# Patient Record
Sex: Female | Born: 1935 | Hispanic: Refuse to answer | Marital: Married | Smoking: Never smoker
Health system: Southern US, Community
[De-identification: ages and names within clinical notes are randomized; demographics above are authoritative.]

## PROBLEM LIST (undated history)

## (undated) DIAGNOSIS — E119 Type 2 diabetes mellitus without complications: Secondary | ICD-10-CM

## (undated) DIAGNOSIS — I2510 Atherosclerotic heart disease of native coronary artery without angina pectoris: Secondary | ICD-10-CM

## (undated) DIAGNOSIS — R569 Unspecified convulsions: Secondary | ICD-10-CM

## (undated) HISTORY — PX: CARDIAC SURGERY: SHX584

## (undated) HISTORY — PX: COLONOSCOPY WITH ESOPHAGOGASTRODUODENOSCOPY (EGD): SHX5779

## (undated) HISTORY — PX: ABDOMINAL HYSTERECTOMY: SHX81

## (undated) HISTORY — PX: CHOLECYSTECTOMY: SHX55

---

## 2005-02-06 ENCOUNTER — Ambulatory Visit: Payer: Self-pay | Admitting: Internal Medicine

## 2005-11-18 ENCOUNTER — Ambulatory Visit: Payer: Self-pay | Admitting: Gastroenterology

## 2006-03-12 ENCOUNTER — Ambulatory Visit: Payer: Self-pay | Admitting: Internal Medicine

## 2007-03-24 ENCOUNTER — Ambulatory Visit: Payer: Self-pay | Admitting: Internal Medicine

## 2008-04-18 ENCOUNTER — Ambulatory Visit: Payer: Self-pay | Admitting: Internal Medicine

## 2009-04-23 ENCOUNTER — Ambulatory Visit: Payer: Self-pay | Admitting: Internal Medicine

## 2009-11-17 ENCOUNTER — Inpatient Hospital Stay: Payer: Self-pay | Admitting: Internal Medicine

## 2010-05-08 ENCOUNTER — Ambulatory Visit: Payer: Self-pay | Admitting: Internal Medicine

## 2010-05-15 ENCOUNTER — Ambulatory Visit: Payer: Self-pay | Admitting: Internal Medicine

## 2010-06-11 ENCOUNTER — Ambulatory Visit: Payer: Self-pay | Admitting: Ophthalmology

## 2010-10-23 ENCOUNTER — Ambulatory Visit: Payer: Self-pay | Admitting: Ophthalmology

## 2010-11-05 ENCOUNTER — Ambulatory Visit: Payer: Self-pay | Admitting: Ophthalmology

## 2011-04-07 ENCOUNTER — Ambulatory Visit: Payer: Self-pay | Admitting: Gastroenterology

## 2011-04-08 LAB — PATHOLOGY REPORT

## 2011-08-04 ENCOUNTER — Ambulatory Visit: Payer: Self-pay | Admitting: Internal Medicine

## 2012-08-04 ENCOUNTER — Ambulatory Visit: Payer: Self-pay | Admitting: Internal Medicine

## 2012-12-22 ENCOUNTER — Inpatient Hospital Stay: Payer: Self-pay | Admitting: Internal Medicine

## 2012-12-22 LAB — URINALYSIS, COMPLETE
Bilirubin,UR: NEGATIVE
Hyaline Cast: 7
Ketone: NEGATIVE
Nitrite: POSITIVE
Ph: 6 (ref 4.5–8.0)
Protein: NEGATIVE
RBC,UR: 16 /HPF (ref 0–5)
Specific Gravity: 1.01 (ref 1.003–1.030)
Squamous Epithelial: 3

## 2012-12-22 LAB — CK TOTAL AND CKMB (NOT AT ARMC)
CK, Total: 68 U/L (ref 21–215)
CK-MB: 0.9 ng/mL (ref 0.5–3.6)

## 2012-12-22 LAB — COMPREHENSIVE METABOLIC PANEL
Albumin: 3.8 g/dL (ref 3.4–5.0)
Alkaline Phosphatase: 69 U/L (ref 50–136)
Anion Gap: 9 (ref 7–16)
Calcium, Total: 10 mg/dL (ref 8.5–10.1)
Co2: 27 mmol/L (ref 21–32)
Glucose: 109 mg/dL — ABNORMAL HIGH (ref 65–99)
Osmolality: 283 (ref 275–301)
Potassium: 4.1 mmol/L (ref 3.5–5.1)
SGPT (ALT): 15 U/L (ref 12–78)
Sodium: 137 mmol/L (ref 136–145)

## 2012-12-22 LAB — CBC
HCT: 37.8 % (ref 35.0–47.0)
HGB: 12.7 g/dL (ref 12.0–16.0)
MCH: 28.4 pg (ref 26.0–34.0)
MCV: 85 fL (ref 80–100)
Platelet: 198 10*3/uL (ref 150–440)
RBC: 4.47 10*6/uL (ref 3.80–5.20)
WBC: 9.8 10*3/uL (ref 3.6–11.0)

## 2012-12-22 LAB — PROTIME-INR
INR: 2.2
Prothrombin Time: 24.3 secs — ABNORMAL HIGH (ref 11.5–14.7)

## 2012-12-22 LAB — TROPONIN I: Troponin-I: <0.02 ng/mL

## 2012-12-23 LAB — CBC WITH DIFFERENTIAL/PLATELET
Basophil #: 0.1 10*3/uL (ref 0.0–0.1)
Basophil %: 0.7 %
HCT: 34.5 % — ABNORMAL LOW (ref 35.0–47.0)
HGB: 11.4 g/dL — ABNORMAL LOW (ref 12.0–16.0)
MCH: 27.9 pg (ref 26.0–34.0)
MCHC: 33.1 g/dL (ref 32.0–36.0)
MCV: 84 fL (ref 80–100)
Monocyte %: 11.3 %
Neutrophil #: 6.6 10*3/uL — ABNORMAL HIGH (ref 1.4–6.5)
Neutrophil %: 72.8 %
Platelet: 169 10*3/uL (ref 150–440)
RBC: 4.1 10*6/uL (ref 3.80–5.20)
RDW: 13.7 % (ref 11.5–14.5)

## 2012-12-23 LAB — BASIC METABOLIC PANEL
Anion Gap: 10 (ref 7–16)
BUN: 34 mg/dL — ABNORMAL HIGH (ref 7–18)
Chloride: 103 mmol/L (ref 98–107)
Co2: 24 mmol/L (ref 21–32)
Creatinine: 2.5 mg/dL — ABNORMAL HIGH (ref 0.60–1.30)
EGFR (African American): 20 — ABNORMAL LOW
Osmolality: 282 (ref 275–301)
Potassium: 4.3 mmol/L (ref 3.5–5.1)
Sodium: 137 mmol/L (ref 136–145)

## 2012-12-23 LAB — LIPID PANEL
HDL Cholesterol: 36 mg/dL — ABNORMAL LOW (ref 40–60)
VLDL Cholesterol, Calc: 19 mg/dL (ref 5–40)

## 2012-12-23 LAB — PROTIME-INR: INR: 2.5

## 2012-12-24 LAB — CBC WITH DIFFERENTIAL/PLATELET
Basophil #: 0.1 10*3/uL (ref 0.0–0.1)
Basophil %: 1 %
Eosinophil %: 4.3 %
HCT: 33.4 % — ABNORMAL LOW (ref 35.0–47.0)
Monocyte %: 12.6 %
Neutrophil #: 4.5 10*3/uL (ref 1.4–6.5)
Neutrophil %: 61.4 %
RBC: 4 10*6/uL (ref 3.80–5.20)
RDW: 13.7 % (ref 11.5–14.5)
WBC: 7.3 10*3/uL (ref 3.6–11.0)

## 2012-12-24 LAB — BASIC METABOLIC PANEL
Anion Gap: 10 (ref 7–16)
BUN: 35 mg/dL — ABNORMAL HIGH (ref 7–18)
Chloride: 105 mmol/L (ref 98–107)
EGFR (African American): 22 — ABNORMAL LOW
EGFR (Non-African Amer.): 19 — ABNORMAL LOW
Osmolality: 285 (ref 275–301)
Potassium: 4.2 mmol/L (ref 3.5–5.1)
Sodium: 138 mmol/L (ref 136–145)

## 2012-12-24 LAB — PROTIME-INR: Prothrombin Time: 23.8 secs — ABNORMAL HIGH (ref 11.5–14.7)

## 2012-12-24 LAB — URINE CULTURE

## 2013-08-05 ENCOUNTER — Ambulatory Visit: Payer: Self-pay | Admitting: Internal Medicine

## 2013-08-25 ENCOUNTER — Ambulatory Visit: Payer: Self-pay

## 2015-02-23 NOTE — Discharge Summary (Signed)
PATIENT NAME:  Laura Kim, Laura Kim MR#:  [id number] DATE OF BIRTH:  06-Dec-1929  DATE OF ADMISSION:  12/22/2012 DATE OF DISCHARGE:  12/24/2012  DISCHARGE DIAGNOSES:  1. Evidence for transient ischemic attack versus migraine with aura.  2. Urinary tract infection.  3. Left eye visual changes as noted above.  4. Type 2 diabetes.  5. Hypertension.  6. Hyperlipidemia.  7. Atrial fibrillation.  8. Chronic kidney disease, stage III.   DISCHARGE MEDICATIONS: Per med reconciliation.   HISTORY AND PHYSICAL: Please see detailed history and physical done on admission.   HOSPITAL COURSE: The patient was admitted after 30 minutes of left visual changes, did have a headache afterwards, and she has no history of migraines. Workup was negative with normal MRI, normal CT. She did have a complex-appearing cystic structure on a renal ultrasound that was likely cystic but not clearly so. Radiology recommended a CT scan, but she cannot do that with her kidney dysfunction. Her creatinine was stable around about 2.5 range, cholesterol was good at 120, the LDL 65 on simvastatin. The INR was stable on warfarin which she has been on chronically and has been stable. On urinalysis, showed a urinary tract infection with 668 white cells per high-power field. A urine culture is not available yet at this point. She seems to respond clinically to Cipro so we will continue that. INR to date is not increased substantially on that.  ____________________________ Ocie Cornfield. Ouida Sills, MD mwa:jm D: 12/24/2012 07:38:03 ET T: 12/24/2012 11:42:26 ET JOB#: [id number]  cc: Ocie Cornfield. Ouida Sills, MD, <Dictator> Kirk Ruths MD ELECTRONICALLY SIGNED 12/27/2012 7:21

## 2015-02-23 NOTE — H&P (Signed)
PATIENT NAME:  Laura Kim, Laura Kim MR#:  [id number] DATE OF BIRTH:  05-21-1930  DATE OF ADMISSION:  12/22/2012  PRIMARY CARE PHYSICIAN:  Ocie Cornfield. Ouida Sills, MD  CHIEF COMPLAINT:  Confusion and could not focus left eye.   HISTORY OF PRESENT ILLNESS:  This is an 81 year old female who presents to the ER. She was going to a ballgame. She was unable to focus her left eye and her fingers fell asleep, both index fingers. That episode passed and then she felt fine. She was sitting on the bleachers watching the ball game and she could not answer questions appropriately from her family. She was confused, tongue-tied, could not comprehend, the words were mixed up, her tongue was thick and that episode lasted 30 minutes and then resolved. She was then confused again on the way to the Emergency Room. She is currently answering questions appropriately. 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 Her white count was normal, but she was found to be in acute renal failure with a creatinine of 2.54 and urinalysis that showed leukocyte esterase and 2+ blood. Hospitalist services were contacted for further evaluation.   PAST MEDICAL HISTORY:  Hypertension, diabetes, coronary artery disease, atrial fibrillation, hyperlipidemia and possible asthma.   PAST SURGICAL HISTORY:  Cataracts, CABG, gallbladder, stent in the right renal artery, hysterectomy.   ALLERGIES:  PENICILLIN.   MEDICATIONS:  Include Advair Diskus 250/50 mcg 1 inhalation twice a day, amlodipine/benazepril probably 5/20 mg once a day, aspirin 81 mg daily, calcium and vitamin D twice a day, carvedilol 12.5 mg twice a day, Humalog 6 units twice a day, Jantoven which is Coumadin 5 mg in the evening, Lantus 8 mg at bedtime, simvastatin 20 mg at bedtime, spironolactone 25 mg daily, torsemide 5 mg daily.    SOCIAL HISTORY:  No smoking. No alcohol. No drug use. 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   FAMILY HISTORY:  Mother died at 63 of an MI, also had colon cancer. Father died at 23 of an MI.   REVIEW OF SYSTEMS:   CONSTITUTIONAL: No fever. Positive for chills, no sweats. Positive for left-sided weakness.  EYES: Left-sided visual change.  EARS, NOSE, MOUTH AND THROAT: Positive for runny nose. No sore throat. No difficulty swallowing.  CARDIOVASCULAR: No chest pain. No palpitations.  RESPIRATORY: Positive for shortness of breath. No coughing. No sputum. No hemoptysis.  GASTROINTESTINAL: No nausea. No vomiting. No abdominal pain. Positive for diarrhea on and off since last March. Dr. Vira Agar put him on Imodium.  GENITOURINARY: Positive for burning on urination. Had urinary tract infection last month.  INTEGUMENT: No rashes or eruptions.  NEUROLOGIC: Positive for slurred speech and weakness on the left side.  PSYCHIATRIC: No anxiety or depression.  ENDOCRINE: No thyroid problems.  HEMATOLOGIC/LYMPHATIC: No anemia. No easy bruising or bleeding.   PHYSICAL EXAMINATION: VITAL SIGNS: Temperature 98.7, pulse 65, respirations 20, blood pressure 169/74, pulse oximetry 96%.  GENERAL: No respiratory distress.  EYES: Conjunctivae and lids normal. Pupils equal, round, and reactive to light. Extraocular muscles intact. No nystagmus.  EARS, NOSE, MOUTH AND THROAT: Tympanic membranes: No erythema. Nasal mucosa: No erythema. Throat: No erythema, no exudate seen. Lips and gums: No lesions.  NECK: No JVD. No bruits. No lymphadenopathy. No thyromegaly. No thyroid nodules palpated.  RESPIRATORY: Lungs are clear to auscultation. No  use of accessory muscles to breathe. No rhonchi, rales or wheeze heard.  CARDIOVASCULAR: S1, S2 normal. No gallops, rubs, or murmurs heard. Carotid upstroke 2+ bilaterally. No bruits.  EXTREMITIES: Dorsalis pedis pulses 2+ bilaterally. No edema of the lower  extremity.  ABDOMEN: Soft, nontender. No organosplenomegaly. Normoactive bowel sounds. No masses felt.  LYMPHATICS: No lymph nodes in the neck.  MUSCULOSKELETAL: No clubbing, edema or cyanosis.  SKIN: No rashes or ulcers seen.  NEUROLOGIC: Cranial nerves II through XII grossly intact. Deep tendon reflexes 1+ bilateral lower extremity. Power 5 out of 5 in upper and lower extremities. Finger to nose intact. Babinski equivocal on the right and negative on the left. Sensation intact to light touch. Visual acuity left eye with reading glasses at 20/40.   ASSESSMENT AND PLAN:   1.  Acute renal failure. We will give intravenous fluid hydration and hold benazepril, torsemide and spironolactone at this point. We will get a renal ultrasound.  2.  Urinary tract infection. I will give intravenous Cipro 400 mg b.i.d. and send off a urine culture.  3.  Possible transient ischemic attack with left visual issue and concentration issue. This could also be secondary to acute renal failure and urinary tract infection, but since the patient did have a visual issue, we will get an MRI of the brain to rule out transient ischemic attack or stroke. The patient is already on aspirin and Coumadin, not much more to do for stroke prevention.  4.  Hypertension. Blood pressure is slightly elevated at 169/74. We will continue her usual medications except for the benazepril at this point and torsemide and continue to monitor. Allow blood pressure to be a little bit higher today with a possible transient ischemic attack.  5.  Diabetes. Continue Lantus and Humalog sliding scale.  6.  Coronary artery disease and also atrial fibrillation. Coumadin level is therapeutic. The patient is on aspirin also and rate controlled with Coreg.  7.  Hyperlipidemia: The patient is on Zocor. We will check a lipid profile in the a.m.   TIME SPENT ON ADMISSION:  55 minutes.   The patient is a DNR.    ____________________________ Tana Conch.  Leslye Peer, MD rjw:si D: 12/22/2012 22:27:17 ET T: 12/22/2012 23:20:48 ET JOB#: [id number]  cc: Tana Conch. Leslye Peer, MD, <Dictator> Ocie Cornfield. Ouida Sills, [place] MD ELECTRONICALLY SIGNED 12/29/2012 17:34

## 2015-11-14 ENCOUNTER — Other Ambulatory Visit
Admission: RE | Admit: 2015-11-14 | Discharge: 2015-11-14 | Disposition: A | Discharge status: Home / Self Care | Payer: Medicare Other | Source: Ambulatory Visit | Attending: Podiatry | Admitting: Podiatry

## 2015-11-14 DIAGNOSIS — L02611 Cutaneous abscess of right foot: Secondary | ICD-10-CM | POA: Diagnosis present

## 2015-11-22 LAB — ANAEROBIC CULTURE: CULTURE: NEGATIVE

## 2016-03-27 LAB — WOUND CULTURE

## 2017-06-02 ENCOUNTER — Other Ambulatory Visit
Admission: RE | Admit: 2017-06-02 | Discharge: 2017-06-02 | Disposition: A | Discharge status: Home / Self Care | Payer: Medicare Other | Source: Ambulatory Visit | Attending: Gastroenterology | Admitting: Gastroenterology

## 2017-06-02 DIAGNOSIS — R197 Diarrhea, unspecified: Secondary | ICD-10-CM | POA: Insufficient documentation

## 2017-06-02 DIAGNOSIS — R1084 Generalized abdominal pain: Secondary | ICD-10-CM | POA: Insufficient documentation

## 2017-06-02 LAB — GASTROINTESTINAL PANEL BY PCR, STOOL (REPLACES STOOL CULTURE)

## 2017-06-02 LAB — C DIFFICILE QUICK SCREEN W PCR REFLEX
C Diff antigen: NEGATIVE
C Diff interpretation: NOT DETECTED
C Diff toxin: NEGATIVE

## 2017-08-20 ENCOUNTER — Other Ambulatory Visit
Admission: RE | Admit: 2017-08-20 | Discharge: 2017-08-20 | Disposition: A | Discharge status: Home / Self Care | Payer: Medicare Other | Source: Ambulatory Visit | Attending: Nurse Practitioner | Admitting: Nurse Practitioner

## 2017-08-20 DIAGNOSIS — R197 Diarrhea, unspecified: Secondary | ICD-10-CM | POA: Insufficient documentation

## 2017-08-24 LAB — PANCREATIC ELASTASE, FECAL: Pancreatic Elastase-1, Stool: 201 ug Elast./g (ref >200)

## 2019-07-13 ENCOUNTER — Encounter: Payer: Self-pay | Admitting: Emergency Medicine

## 2019-07-13 ENCOUNTER — Other Ambulatory Visit: Payer: Self-pay

## 2019-07-13 ENCOUNTER — Emergency Department: Payer: Medicare Other

## 2019-07-13 ENCOUNTER — Inpatient Hospital Stay
Admission: EM | Admit: 2019-07-13 | Discharge: 2019-07-20 | DRG: 377 | Disposition: A | Discharge status: Home Health | Payer: Medicare Other | Attending: Internal Medicine | Admitting: Internal Medicine

## 2019-07-13 DIAGNOSIS — Z90710 Acquired absence of both cervix and uterus: Secondary | ICD-10-CM | POA: Diagnosis not present

## 2019-07-13 DIAGNOSIS — Z7951 Long term (current) use of inhaled steroids: Secondary | ICD-10-CM

## 2019-07-13 DIAGNOSIS — R0602 Shortness of breath: Secondary | ICD-10-CM | POA: Diagnosis present

## 2019-07-13 DIAGNOSIS — Z951 Presence of aortocoronary bypass graft: Secondary | ICD-10-CM

## 2019-07-13 DIAGNOSIS — Z7982 Long term (current) use of aspirin: Secondary | ICD-10-CM

## 2019-07-13 DIAGNOSIS — Z20828 Contact with and (suspected) exposure to other viral communicable diseases: Secondary | ICD-10-CM | POA: Diagnosis present

## 2019-07-13 DIAGNOSIS — K921 Melena: Secondary | ICD-10-CM

## 2019-07-13 DIAGNOSIS — I4821 Permanent atrial fibrillation: Secondary | ICD-10-CM | POA: Diagnosis present

## 2019-07-13 DIAGNOSIS — Z7901 Long term (current) use of anticoagulants: Secondary | ICD-10-CM | POA: Diagnosis not present

## 2019-07-13 DIAGNOSIS — Z880 Allergy status to penicillin: Secondary | ICD-10-CM

## 2019-07-13 DIAGNOSIS — K31819 Angiodysplasia of stomach and duodenum without bleeding: Secondary | ICD-10-CM | POA: Diagnosis not present

## 2019-07-13 DIAGNOSIS — I5033 Acute on chronic diastolic (congestive) heart failure: Secondary | ICD-10-CM | POA: Diagnosis present

## 2019-07-13 DIAGNOSIS — K922 Gastrointestinal hemorrhage, unspecified: Secondary | ICD-10-CM | POA: Diagnosis not present

## 2019-07-13 DIAGNOSIS — Z23 Encounter for immunization: Secondary | ICD-10-CM

## 2019-07-13 DIAGNOSIS — E876 Hypokalemia: Secondary | ICD-10-CM | POA: Diagnosis present

## 2019-07-13 DIAGNOSIS — E1122 Type 2 diabetes mellitus with diabetic chronic kidney disease: Secondary | ICD-10-CM | POA: Diagnosis present

## 2019-07-13 DIAGNOSIS — D631 Anemia in chronic kidney disease: Secondary | ICD-10-CM | POA: Diagnosis present

## 2019-07-13 DIAGNOSIS — I130 Hypertensive heart and chronic kidney disease with heart failure and stage 1 through stage 4 chronic kidney disease, or unspecified chronic kidney disease: Secondary | ICD-10-CM | POA: Diagnosis present

## 2019-07-13 DIAGNOSIS — I2510 Atherosclerotic heart disease of native coronary artery without angina pectoris: Secondary | ICD-10-CM | POA: Diagnosis present

## 2019-07-13 DIAGNOSIS — N179 Acute kidney failure, unspecified: Secondary | ICD-10-CM | POA: Diagnosis present

## 2019-07-13 DIAGNOSIS — R791 Abnormal coagulation profile: Secondary | ICD-10-CM

## 2019-07-13 DIAGNOSIS — D649 Anemia, unspecified: Secondary | ICD-10-CM | POA: Diagnosis present

## 2019-07-13 DIAGNOSIS — J189 Pneumonia, unspecified organism: Secondary | ICD-10-CM | POA: Diagnosis present

## 2019-07-13 DIAGNOSIS — J9600 Acute respiratory failure, unspecified whether with hypoxia or hypercapnia: Secondary | ICD-10-CM

## 2019-07-13 DIAGNOSIS — Z79899 Other long term (current) drug therapy: Secondary | ICD-10-CM

## 2019-07-13 DIAGNOSIS — Z794 Long term (current) use of insulin: Secondary | ICD-10-CM | POA: Diagnosis not present

## 2019-07-13 DIAGNOSIS — N184 Chronic kidney disease, stage 4 (severe): Secondary | ICD-10-CM | POA: Diagnosis present

## 2019-07-13 DIAGNOSIS — K31811 Angiodysplasia of stomach and duodenum with bleeding: Secondary | ICD-10-CM | POA: Diagnosis present

## 2019-07-13 DIAGNOSIS — E785 Hyperlipidemia, unspecified: Secondary | ICD-10-CM | POA: Diagnosis present

## 2019-07-13 DIAGNOSIS — J9601 Acute respiratory failure with hypoxia: Secondary | ICD-10-CM | POA: Diagnosis present

## 2019-07-13 DIAGNOSIS — D6832 Hemorrhagic disorder due to extrinsic circulating anticoagulants: Secondary | ICD-10-CM | POA: Diagnosis present

## 2019-07-13 DIAGNOSIS — Z66 Do not resuscitate: Secondary | ICD-10-CM | POA: Diagnosis not present

## 2019-07-13 DIAGNOSIS — K449 Diaphragmatic hernia without obstruction or gangrene: Secondary | ICD-10-CM | POA: Diagnosis present

## 2019-07-13 DIAGNOSIS — Z8249 Family history of ischemic heart disease and other diseases of the circulatory system: Secondary | ICD-10-CM

## 2019-07-13 DIAGNOSIS — D62 Acute posthemorrhagic anemia: Secondary | ICD-10-CM | POA: Diagnosis present

## 2019-07-13 HISTORY — DX: Unspecified convulsions: R56.9

## 2019-07-13 HISTORY — DX: Atherosclerotic heart disease of native coronary artery without angina pectoris: I25.10

## 2019-07-13 HISTORY — DX: Type 2 diabetes mellitus without complications: E11.9

## 2019-07-13 LAB — URINALYSIS, COMPLETE (UACMP) WITH MICROSCOPIC
Bilirubin Urine: NEGATIVE
Glucose, UA: 50 mg/dL — AB
Ketones, ur: NEGATIVE mg/dL
Nitrite: NEGATIVE
Protein, ur: 100 mg/dL — AB
Specific Gravity, Urine: 1.011 (ref 1.005–1.030)
WBC, UA: >50 WBC/hpf — ABNORMAL HIGH (ref 0–5)
pH: 5 (ref 5.0–8.0)

## 2019-07-13 LAB — CBC
HCT: 18.5 % — ABNORMAL LOW (ref 36.0–46.0)
Hemoglobin: 5.7 g/dL — ABNORMAL LOW (ref 12.0–15.0)
MCH: 27.7 pg (ref 26.0–34.0)
MCHC: 30.8 g/dL (ref 30.0–36.0)
MCV: 89.8 fL (ref 80.0–100.0)
Platelets: 257 10*3/uL (ref 150–400)
RBC: 2.06 MIL/uL — ABNORMAL LOW (ref 3.87–5.11)
RDW: 16.1 % — ABNORMAL HIGH (ref 11.5–15.5)
WBC: 5.9 10*3/uL (ref 4.0–10.5)
nRBC: 0 % (ref 0.0–0.2)

## 2019-07-13 LAB — ABO/RH: ABO/RH(D): A POS

## 2019-07-13 LAB — BASIC METABOLIC PANEL
Anion gap: 12 (ref 5–15)
BUN: 83 mg/dL — ABNORMAL HIGH (ref 8–23)
CO2: 22 mmol/L (ref 22–32)
Calcium: 10.2 mg/dL (ref 8.9–10.3)
Chloride: 104 mmol/L (ref 98–111)
Creatinine, Ser: 4.96 mg/dL — ABNORMAL HIGH (ref 0.44–1.00)
GFR calc Af Amer: 9 mL/min — ABNORMAL LOW (ref >60)
GFR calc non Af Amer: 7 mL/min — ABNORMAL LOW (ref >60)
Glucose, Bld: 218 mg/dL — ABNORMAL HIGH (ref 70–99)
Potassium: 4.3 mmol/L (ref 3.5–5.1)
Sodium: 138 mmol/L (ref 135–145)

## 2019-07-13 LAB — PREPARE RBC (CROSSMATCH)

## 2019-07-13 LAB — PROTIME-INR
INR: 10.1 (ref 0.8–1.2)
Prothrombin Time: 78.6 seconds — ABNORMAL HIGH (ref 11.4–15.2)

## 2019-07-13 MED ORDER — SODIUM CHLORIDE 0.9% IV SOLUTION
Freq: Once | INTRAVENOUS | Status: AC
  Administered 2019-07-14: via INTRAVENOUS
  Filled 2019-07-13: qty 250

## 2019-07-13 MED ORDER — SODIUM CHLORIDE 0.9% FLUSH
3.0000 mL | Freq: Once | INTRAVENOUS | Status: AC
  Administered 2019-07-14: 3 mL via INTRAVENOUS

## 2019-07-13 MED ORDER — PANTOPRAZOLE SODIUM 40 MG IV SOLR: 40.0000 mg | Freq: Two times a day (BID) | INTRAVENOUS | Status: DC

## 2019-07-13 MED ORDER — VITAMIN K1 10 MG/ML IJ SOLN
10.0000 mg | Freq: Once | INTRAVENOUS | Status: AC
  Administered 2019-07-14: 10 mg via INTRAVENOUS
  Filled 2019-07-13: qty 1

## 2019-07-13 MED ORDER — SODIUM CHLORIDE 0.9 % IV SOLN
80.0000 mg | Freq: Once | INTRAVENOUS | Status: AC
  Administered 2019-07-14: 80 mg via INTRAVENOUS
  Filled 2019-07-13: qty 80

## 2019-07-13 MED ORDER — SODIUM CHLORIDE 0.9 % IV SOLN
8.0000 mg/h | INTRAVENOUS | Status: DC
  Administered 2019-07-14 – 2019-07-15 (×2): 8 mg/h via INTRAVENOUS
  Filled 2019-07-13 (×3): qty 80

## 2019-07-13 NOTE — H&P (Signed)
Laura Kim at [facility] NAME: Laura Kim    MR#:  [id number]  DATE OF BIRTH:  02-29-1930  DATE OF ADMISSION:  07/13/2019  PRIMARY CARE PHYSICIAN: Laura Ruths, MD   REQUESTING/REFERRING PHYSICIAN: Blake Divine, MD  CHIEF COMPLAINT:   Chief Complaint  Patient presents with  . Weakness    HISTORY OF PRESENT ILLNESS:  Laura Kim  is a 85 y.o. female with a known history of CAD, A. fib on Coumadin, and diabetes who presents to the ED complaining of generalized weakness.  Patient reports that she has been feeling increasingly weak over the past 1 to 2 weeks.  She was seen by her PCP who referred her to the ED for abnormal labs/low hemoglobin.  Her hemoglobin was 5.7 in the emergency department.  She was ordered 2 unit of packed red blood cell transfusion while in ED. Patient states that she has noticed dark black stools over about the past week and has been getting out of breath very easily.  She denies any pain in her chest and has not had any fevers, chills, or cough. PAST MEDICAL HISTORY:   Past Medical History:  Diagnosis Date  . Coronary artery disease   . Diabetes mellitus without complication ([place])    PAST SURGICAL HISTORY:   Past Surgical History:  Procedure Laterality Date  . ABDOMINAL HYSTERECTOMY    . CARDIAC SURGERY      SOCIAL HISTORY:   Social History   Tobacco Use  . Smoking status: Never Smoker  Substance Use Topics  . Alcohol use: Never    Frequency: Never   FAMILY HISTORY:  No family history on file. Mother died of cancer and father had heart disease DRUG ALLERGIES:   Allergies  Allergen Reactions  . Penicillins Itching, Rash and Other (See Comments)    Did it involve swelling of the face/tongue/throat, SOB, or low BP? NO Did it involve sudden or severe rash/hives, skin peeling, or any reaction on the inside of your mouth or nose? Yes Did you need to seek medical attention at a  hospital or doctor's office? Unknown When did it last happen?>35 years If all above answers are "NO", may proceed with cephalosporin use.    REVIEW OF SYSTEMS:   Review of Systems  Constitutional: Positive for malaise/fatigue. Negative for diaphoresis, fever and weight loss.  HENT: Negative for ear discharge, ear pain, hearing loss, nosebleeds, sore throat and tinnitus.   Eyes: Negative for blurred vision and pain.  Respiratory: Negative for cough, hemoptysis, shortness of breath and wheezing.   Cardiovascular: Negative for chest pain, palpitations, orthopnea and leg swelling.  Gastrointestinal: Negative for abdominal pain, blood in stool, constipation, diarrhea, heartburn, nausea and vomiting.  Genitourinary: Negative for dysuria, frequency and urgency.  Musculoskeletal: Negative for back pain and myalgias.  Skin: Negative for itching and rash.  Neurological: Negative for dizziness, tingling, tremors, focal weakness, seizures, weakness and headaches.  Psychiatric/Behavioral: Negative for depression. The patient is not nervous/anxious.     MEDICATIONS AT HOME:   Prior to Admission medications   Not on File      VITAL SIGNS:  Blood pressure 135/79, pulse 71, temperature 98 F (36.7 C), temperature source Oral, resp. rate (!) 21, height 5' (1.524 m), weight 99.8 kg, SpO2 100 %.  PHYSICAL EXAMINATION:  Physical Exam  GENERAL:  85 y.o.-year-old patient lying in the bed with no acute distress.  EYES: Pupils equal, round, reactive to light and accommodation. No  scleral icterus. Extraocular muscles intact.  HEENT: Head atraumatic, normocephalic. Oropharynx and nasopharynx clear.  NECK:  Supple, no jugular venous distention. No thyroid enlargement, no tenderness.  LUNGS: Normal breath sounds bilaterally, no wheezing, rales,rhonchi or crepitation. No use of accessory muscles of respiration.  CARDIOVASCULAR: S1, S2 normal. No murmurs, rubs, or gallops.  ABDOMEN: Soft, nontender,  nondistended. Bowel sounds present. No organomegaly or mass.  EXTREMITIES: No pedal edema, cyanosis, or clubbing.  NEUROLOGIC: Cranial nerves II through XII are intact. Muscle strength 5/5 in all extremities. Sensation intact. Gait not checked.  PSYCHIATRIC: The patient is alert and oriented x 3.  SKIN: No obvious rash, lesion, or ulcer.   LABORATORY PANEL:   CBC Recent Labs  Lab 07/13/19 1945  WBC 5.9  HGB 5.7*  HCT 18.5*  PLT 257   ------------------------------------------------------------------------------------------------------------------  Chemistries  Recent Labs  Lab 07/13/19 1945  NA 138  K 4.3  CL 104  CO2 22  GLUCOSE 218*  BUN 83*  CREATININE 4.96*  CALCIUM 10.2   ------------------------------------------------------------------------------------------------------------------  Cardiac Enzymes No results for input(s): TROPONINI in the last 168 hours. ------------------------------------------------------------------------------------------------------------------  RADIOLOGY:  Dg Chest Portable 1 View  Result Date: 07/13/2019 CLINICAL DATA:  Shortness of breath EXAM: PORTABLE CHEST 1 VIEW COMPARISON:  [facility] nineteenth 2014 FINDINGS: There is cardiomegaly with overlying median sternotomy wires. There is pulmonary vascular congestion. There is a trace left pleural effusion. Aortic valve calcifications and mitral valve calcifications are noted. No acute osseous abnormality. IMPRESSION: Pulmonary vascular congestion and probable trace left pleural effusion. Electronically Signed   By: Prudencio Pair M.D.   On: 07/13/2019 22:44   IMPRESSION AND PLAN:  85 year old female is being admitted for anemia  *Anemia: Likely acute on chronic blood loss -2 packed red blood cell transfusion -She is hemodynamically stable, no active GI bleeding -Protonix IV twice daily -GI consultation  *Acquired coagulopathy:  - Her INR was 10.1, she has been given vitamin K in the  emergency department -Hold Coumadin  *Acute on chronic kidney disease stage IV -Hydrate with IV fluids -Obtain renal ultrasound -Monitor kidney function -If no improvement with hydration, consider nephrology consultation -Avoid nephrotoxic medication  *Permanent atrial fibrillation: On Coumadin -Rate is well controlled, holding Coumadin as INR 10.1   All the records are reviewed and case discussed with ED provider. Management plans discussed with the patient, nursing and they are in agreement.  CODE STATUS: Full code  TOTAL TIME (critical care) TAKING CARE OF THIS PATIENT: 45 minutes.    Max Sane M.D on 07/14/2019 at 12:58 AM  Between 7am to 6pm - Pager - [phone]  After 6pm go to [email or web address] - [employer] bre[employer] [place] [facility]  Office  [phone]  CC: Primary care physician; Laura Ruths, MD   Note: This dictation was prepared with Dragon dictation along with smaller phrase technology. Any transcriptional errors that result from this process are unintentional.

## 2019-07-13 NOTE — ED Triage Notes (Signed)
Pt to ER states she was seen at PCP today because she was not feeling well.  States she got a call after coming home that she needed to come out to ER due to low blood counts.

## 2019-07-13 NOTE — ED Provider Notes (Signed)
[facility] Emergency Department Provider Note   ____________________________________________   First MD Initiated Contact with Patient 07/13/19 2206     (approximate)  I have reviewed the triage vital signs and the nursing notes.   HISTORY  Chief Complaint Weakness    HPI Laura Kim is a 85 y.o. female with past medical history of CAD, A. fib on Coumadin, and diabetes who presents to the ED complaining of generalized weakness.  Patient reports that she has been feeling increasingly weak over the past 1 to 2 weeks.  She was seen by her PCP for this problem earlier today and referred to the ED when found to have a low hemoglobin.  Patient states that she has noticed dark black stools over about the past week and has been getting out of breath very easily.  She denies any pain in her chest and has not had any fevers, chills, or cough.        Past Medical History:  Diagnosis Date  . Coronary artery disease   . Diabetes mellitus without complication [facility])     Patient Active Problem List   Diagnosis Date Noted  . Anemia 07/13/2019    Past Surgical History:  Procedure Laterality Date  . ABDOMINAL HYSTERECTOMY    . CARDIAC SURGERY      Prior to Admission medications   Medication Sig Start Date End Date Taking? Authorizing Provider  albuterol (VENTOLIN HFA) 108 (90 Base) MCG/ACT inhaler Inhale 2 puffs into the lungs every 6 (six) hours as needed for wheezing or shortness of breath (cough).   Yes [provider]  amLODipine (NORVASC) 10 MG tablet Take 10 mg by mouth daily.   Yes [provider]  aspirin 81 MG chewable tablet Chew 81 mg by mouth daily.   Yes [provider]  benazepril (LOTENSIN) 20 MG tablet Take 20 mg by mouth daily.   Yes [provider]  Fluticasone-Salmeterol (ADVAIR) 250-50 MCG/DOSE AEPB Inhale 1 puff into the lungs 2 (two) times daily.   Yes [provider]  insulin glargine  (LANTUS) 100 unit/mL SOPN Inject 6 Units into the skin at bedtime.   Yes [provider]  insulin lispro (HUMALOG KWIKPEN) 100 UNIT/ML KwikPen Inject 6 Units into the skin 2 (two) times daily with a meal.    Yes [provider]  simvastatin (ZOCOR) 20 MG tablet Take 20 mg by mouth daily.   Yes [provider]  torsemide (DEMADEX) 10 MG tablet Take 5 mg by mouth daily.   Yes [provider]  warfarin (COUMADIN) 5 MG tablet Take 5 mg by mouth daily.   Yes [provider]    Allergies Penicillins  No family history on file.  Social History Social History   Tobacco Use  . Smoking status: Never Smoker  Substance Use Topics  . Alcohol use: Never    Frequency: Never  . Drug use: Never    Review of Systems  Constitutional: No fever/chills.  Positive for generalized weakness. Eyes: No visual changes. ENT: No sore throat. Cardiovascular: Denies chest pain. Respiratory: Denies shortness of breath. Gastrointestinal: No abdominal pain.  No nausea, no vomiting.  No diarrhea.  No constipation.  Positive for blood in stool. Genitourinary: Negative for dysuria. Musculoskeletal: Negative for back pain. Skin: Negative for rash. Neurological: Negative for headaches, focal weakness or numbness.  ____________________________________________   PHYSICAL EXAM:  VITAL SIGNS: ED Triage Vitals  Enc Vitals Group     BP 07/13/19 1938 Marland Kitchen)  148/42     Pulse Rate 07/13/19 1938 69     Resp 07/13/19 1938 18     Temp 07/13/19 1938 98.4 F (36.9 C)     Temp Source 07/13/19 1938 Oral     SpO2 07/13/19 1938 98 %     Weight 07/13/19 1940 220 lb (99.8 kg)     Height 07/13/19 1940 5' (1.524 m)     Head Circumference --      Peak Flow --      Pain Score 07/13/19 1938 0     Pain Loc --      Pain Edu? --      Excl. in [place]? --     Constitutional: Alert and oriented.  Pale appearing. Eyes: Conjunctivae are normal. Head: Atraumatic. Nose: No  congestion/rhinnorhea. Mouth/Throat: Mucous membranes are moist. Neck: Normal ROM Cardiovascular: Normal rate, regular rhythm. Grossly normal heart sounds. Respiratory: Normal respiratory effort.  No retractions. Lungs CTAB. Gastrointestinal: Soft and nontender. No distention.  Guaiac positive melanotic stool.  Ecchymosis at injection sites on abdomen. Genitourinary: deferred Musculoskeletal: No lower extremity tenderness nor edema. Neurologic:  Normal speech and language. No gross focal neurologic deficits are appreciated. Skin:  Skin is warm, dry and intact. No rash noted. Psychiatric: Mood and affect are normal. Speech and behavior are normal.  ____________________________________________   LABS (all labs ordered are listed, but only abnormal results are displayed)  Labs Reviewed  BASIC METABOLIC PANEL - Abnormal; Notable for the following components:      Result Value   Glucose, Bld 218 (*)    BUN 83 (*)    Creatinine, Ser 4.96 (*)    GFR calc non Af Amer 7 (*)    GFR calc Af Amer 9 (*)    All other components within normal limits  CBC - Abnormal; Notable for the following components:   RBC 2.06 (*)    Hemoglobin 5.7 (*)    HCT 18.5 (*)    RDW 16.1 (*)    All other components within normal limits  URINALYSIS, COMPLETE (UACMP) WITH MICROSCOPIC - Abnormal; Notable for the following components:   Color, Urine AMBER (*)    APPearance TURBID (*)    Glucose, UA 50 (*)    Hgb urine dipstick MODERATE (*)    Protein, ur 100 (*)    Leukocytes,Ua LARGE (*)    WBC, UA >50 (*)    Bacteria, UA RARE (*)    All other components within normal limits  PROTIME-INR - Abnormal; Notable for the following components:   Prothrombin Time 78.6 (*)    INR 10.1 (*)    All other components within normal limits  SARS CORONAVIRUS 2 (TAT 6-24 HRS)  APTT  CBG MONITORING, ED  TYPE AND SCREEN  PREPARE RBC (CROSSMATCH)  ABO/RH  PREPARE FRESH FROZEN PLASMA  TROPONIN I (HIGH SENSITIVITY)    ____________________________________________  EKG  ED ECG REPORT I, Blake Divine, the attending physician, personally viewed and interpreted this ECG.   Date: 07/13/2019  EKG Time: 19:51  Rate: 74  Rhythm: atrial fibrillation, rate 74  Axis: LAD  Intervals:none  ST&T Change: None    PROCEDURES  Procedure(s) performed (including Critical Care):  .Critical Care Performed by: Blake Divine, MD Authorized by: Blake Divine, MD   Critical care provider statement:    Critical care time (minutes):  45   Critical care time was exclusive of:  Separately billable procedures and treating other patients and teaching time   Critical care was  necessary to treat or prevent imminent or life-threatening deterioration of the following conditions:  Circulatory failure   Critical care was time spent personally by me on the following activities:  Discussions with consultants, evaluation of patient's response to treatment, examination of patient, ordering and performing treatments and interventions, ordering and review of laboratory studies, ordering and review of radiographic studies, pulse oximetry, re-evaluation of patient's condition, obtaining history from patient or surrogate and review of old charts   I assumed direction of critical care for this patient from another provider in my specialty: no       ____________________________________________   INITIAL IMPRESSION / ASSESSMENT AND PLAN / ED COURSE       85 year old female on Coumadin for atrial fibrillation presents to the ED for increasing weakness, dark stools, and low hemoglobin on outside labs.  Patient appears to have slow upper GI bleed given melanotic stool and low hemoglobin.  Doubt brisk bleed as she is hemodynamically stable and has been noticing dark black stool for at least the past week.  No apparent risk factors for upper GI bleed, will start on Protonix.  She will required 2 units of PRBCs, will also transfuse FFP  to reverse her supratherapeutic INR.  Will hold off on Kcentra given apparent slow bleeding, but will give IV vitamin K.  Patient additionally noted to have AKI, likely contributing to her supratherapeutic INR.  Shortness of breath is likely secondary to anemia.      ____________________________________________   FINAL CLINICAL IMPRESSION(S) / ED DIAGNOSES  Final diagnoses:  Upper GI bleed  Shortness of breath  AKI (acute kidney injury) ([place])  Elevated international normalized ratio (INR)     ED Discharge Orders    None       Note:  This document was prepared using Dragon voice recognition software and may include unintentional dictation errors.   Blake Divine, MD 07/13/19 [phone]

## 2019-07-14 ENCOUNTER — Inpatient Hospital Stay
Admit: 2019-07-14 | Discharge: 2019-07-14 | Disposition: A | Discharge status: Home / Self Care | Payer: Medicare Other | Attending: Critical Care Medicine | Admitting: Critical Care Medicine

## 2019-07-14 DIAGNOSIS — K922 Gastrointestinal hemorrhage, unspecified: Secondary | ICD-10-CM

## 2019-07-14 DIAGNOSIS — J9601 Acute respiratory failure with hypoxia: Secondary | ICD-10-CM

## 2019-07-14 LAB — CBC
HCT: 24.7 % — ABNORMAL LOW (ref 36.0–46.0)
Hemoglobin: 8 g/dL — ABNORMAL LOW (ref 12.0–15.0)
MCH: 27.1 pg (ref 26.0–34.0)
MCHC: 32.4 g/dL (ref 30.0–36.0)
MCV: 83.7 fL (ref 80.0–100.0)
Platelets: 245 10*3/uL (ref 150–400)
RBC: 2.95 MIL/uL — ABNORMAL LOW (ref 3.87–5.11)
RDW: 18.1 % — ABNORMAL HIGH (ref 11.5–15.5)
WBC: 8.7 10*3/uL (ref 4.0–10.5)
nRBC: 0 % (ref 0.0–0.2)

## 2019-07-14 LAB — BASIC METABOLIC PANEL
Anion gap: 13 (ref 5–15)
BUN: 85 mg/dL — ABNORMAL HIGH (ref 8–23)
CO2: 20 mmol/L — ABNORMAL LOW (ref 22–32)
Calcium: 10.2 mg/dL (ref 8.9–10.3)
Chloride: 106 mmol/L (ref 98–111)
Creatinine, Ser: 4.75 mg/dL — ABNORMAL HIGH (ref 0.44–1.00)
GFR calc Af Amer: 9 mL/min — ABNORMAL LOW (ref >60)
GFR calc non Af Amer: 8 mL/min — ABNORMAL LOW (ref >60)
Glucose, Bld: 166 mg/dL — ABNORMAL HIGH (ref 70–99)
Potassium: 4.1 mmol/L (ref 3.5–5.1)
Sodium: 139 mmol/L (ref 135–145)

## 2019-07-14 LAB — HEMOGLOBIN AND HEMATOCRIT, BLOOD
HCT: 24.7 % — ABNORMAL LOW (ref 36.0–46.0)
HCT: 24.5 % — ABNORMAL LOW (ref 36.0–46.0)
Hemoglobin: 7.9 g/dL — ABNORMAL LOW (ref 12.0–15.0)
Hemoglobin: 7.8 g/dL — ABNORMAL LOW (ref 12.0–15.0)

## 2019-07-14 LAB — SARS CORONAVIRUS 2 (TAT 6-24 HRS): SARS Coronavirus 2: NEGATIVE

## 2019-07-14 LAB — MRSA PCR SCREENING: MRSA by PCR: NEGATIVE

## 2019-07-14 LAB — GLUCOSE, CAPILLARY
Glucose-Capillary: 146 mg/dL — ABNORMAL HIGH (ref 70–99)
Glucose-Capillary: 159 mg/dL — ABNORMAL HIGH (ref 70–99)
Glucose-Capillary: 85 mg/dL (ref 70–99)

## 2019-07-14 LAB — TROPONIN I (HIGH SENSITIVITY): Troponin I (High Sensitivity): 54 ng/L — ABNORMAL HIGH (ref <18)

## 2019-07-14 LAB — PROTIME-INR
INR: 2 — ABNORMAL HIGH (ref 0.8–1.2)
Prothrombin Time: 22.1 seconds — ABNORMAL HIGH (ref 11.4–15.2)

## 2019-07-14 MED ORDER — SODIUM CHLORIDE 0.9 % IV SOLN
INTRAVENOUS | Status: DC
  Administered 2019-07-14 – 2019-07-15 (×2): via INTRAVENOUS

## 2019-07-14 MED ORDER — HYDROCODONE-ACETAMINOPHEN 5-325 MG PO TABS: 1.0000 | ORAL_TABLET | ORAL | Status: DC | PRN

## 2019-07-14 MED ORDER — TRAZODONE HCL 50 MG PO TABS: 25.0000 mg | ORAL_TABLET | Freq: Every evening | ORAL | Status: DC | PRN

## 2019-07-14 MED ORDER — BISACODYL 5 MG PO TBEC: 5.0000 mg | DELAYED_RELEASE_TABLET | Freq: Every day | ORAL | Status: DC | PRN

## 2019-07-14 MED ORDER — SODIUM CHLORIDE 0.9 % IV SOLN: INTRAVENOUS | Status: DC

## 2019-07-14 MED ORDER — ACETAMINOPHEN 325 MG PO TABS: 650.0000 mg | ORAL_TABLET | Freq: Four times a day (QID) | ORAL | Status: DC | PRN

## 2019-07-14 MED ORDER — FUROSEMIDE 10 MG/ML IJ SOLN
40.0000 mg | Freq: Once | INTRAMUSCULAR | Status: AC
  Administered 2019-07-14: 40 mg via INTRAVENOUS
  Filled 2019-07-14: qty 4

## 2019-07-14 MED ORDER — INSULIN ASPART 100 UNIT/ML ~~LOC~~ SOLN: 0.0000 [IU] | Freq: Three times a day (TID) | SUBCUTANEOUS | Status: DC

## 2019-07-14 MED ORDER — IPRATROPIUM-ALBUTEROL 0.5-2.5 (3) MG/3ML IN SOLN
3.0000 mL | Freq: Four times a day (QID) | RESPIRATORY_TRACT | Status: DC
  Administered 2019-07-14 – 2019-07-20 (×17): 3 mL via RESPIRATORY_TRACT
  Filled 2019-07-14 (×19): qty 3

## 2019-07-14 MED ORDER — INFLUENZA VAC A&B SA ADJ QUAD 0.5 ML IM PRSY
0.5000 mL | PREFILLED_SYRINGE | INTRAMUSCULAR | Status: DC
  Filled 2019-07-14 (×2): qty 0.5

## 2019-07-14 MED ORDER — ONDANSETRON HCL 4 MG PO TABS: 4.0000 mg | ORAL_TABLET | Freq: Four times a day (QID) | ORAL | Status: DC | PRN

## 2019-07-14 MED ORDER — CHLORHEXIDINE GLUCONATE CLOTH 2 % EX PADS
6.0000 | MEDICATED_PAD | Freq: Every day | CUTANEOUS | Status: DC
  Administered 2019-07-14: 15:00:00 6 via TOPICAL

## 2019-07-14 MED ORDER — ONDANSETRON HCL 4 MG/2ML IJ SOLN: 4.0000 mg | Freq: Four times a day (QID) | INTRAMUSCULAR | Status: DC | PRN

## 2019-07-14 MED ORDER — INSULIN ASPART 100 UNIT/ML ~~LOC~~ SOLN: 0.0000 [IU] | Freq: Every day | SUBCUTANEOUS | Status: DC

## 2019-07-14 MED ORDER — INSULIN ASPART 100 UNIT/ML ~~LOC~~ SOLN
0.0000 [IU] | SUBCUTANEOUS | Status: DC
  Administered 2019-07-14: 2 [IU] via SUBCUTANEOUS
  Administered 2019-07-15: 3 [IU] via SUBCUTANEOUS
  Filled 2019-07-14 (×2): qty 1

## 2019-07-14 MED ORDER — FUROSEMIDE 10 MG/ML IJ SOLN
40.0000 mg | Freq: Two times a day (BID) | INTRAMUSCULAR | Status: DC
  Administered 2019-07-15: 40 mg via INTRAVENOUS
  Filled 2019-07-14: qty 4

## 2019-07-14 MED ORDER — ACETAMINOPHEN 650 MG RE SUPP: 650.0000 mg | Freq: Four times a day (QID) | RECTAL | Status: DC | PRN

## 2019-07-14 MED ORDER — DOCUSATE SODIUM 100 MG PO CAPS
100.0000 mg | ORAL_CAPSULE | Freq: Two times a day (BID) | ORAL | Status: DC
  Administered 2019-07-15 – 2019-07-20 (×10): 100 mg via ORAL
  Filled 2019-07-14 (×11): qty 1

## 2019-07-14 MED ORDER — HYDRALAZINE HCL 20 MG/ML IJ SOLN
10.0000 mg | INTRAMUSCULAR | Status: DC | PRN
  Administered 2019-07-14: 20 mg via INTRAVENOUS
  Filled 2019-07-14: qty 1

## 2019-07-14 NOTE — Consult Note (Signed)
Laura Lame, MD [facility]  [address] [place], [place] [address] Phone: [phone] Fax : [phone]  Consultation  Referring Provider:     Dr. Manuella Ghazi Primary Care Physician:  Kirk Ruths, MD Primary Gastroenterologist: Althia Forts         Reason for Consultation:     Melena  Date of Admission:  07/13/2019 Date of Consultation:  07/14/2019         HPI:   Laura Kim is a 85 y.o. female who has a history of A. fib on Coumadin.  The patient also has diabetes and coronary artery disease.  She came to the emergency room for weakness and was found to have a hemoglobin of 5.7.  The patient was given 2 units of blood cells and had other blood work showing that her INR was prolonged at 10.  The patient was given vitamin K and fresh frozen plasma with a repeat INR this morning of 2.  The patient states that she has been having black stools for the past week.  Her main concern was that she was feeling weak and easily became short of breath.  The hemoglobin this morning came up to 8.0. The patient denies any use of any anti-inflammatories except a daily aspirin.  She also denies any history of peptic ulcer disease in the past.  She also states that she does not have any abdominal pain associated with black stools.  Past Medical History:  Diagnosis Date  . Coronary artery disease   . Diabetes mellitus without complication [name])     Past Surgical History:  Procedure Laterality Date  . ABDOMINAL HYSTERECTOMY    . CARDIAC SURGERY      Prior to Admission medications   Medication Sig Start Date End Date Taking? Authorizing Provider  amLODipine (NORVASC) 10 MG tablet Take 10 mg by mouth daily.   Yes [provider]  aspirin 81 MG chewable tablet Chew 81 mg by mouth daily.   Yes [provider]  benazepril (LOTENSIN) 20 MG tablet Take 20 mg by mouth daily.   Yes [provider]  insulin glargine (LANTUS) 100 unit/mL SOPN Inject 6 Units into the  skin at bedtime.   Yes [provider]  insulin lispro (HUMALOG KWIKPEN) 100 UNIT/ML KwikPen Inject 6 Units into the skin 2 (two) times daily with a meal.    Yes [provider]  simvastatin (ZOCOR) 20 MG tablet Take 20 mg by mouth daily.   Yes [provider]  torsemide (DEMADEX) 10 MG tablet Take 5 mg by mouth daily.   Yes [provider]  warfarin (COUMADIN) 5 MG tablet Take 5 mg by mouth daily.   Yes [provider]  Fluticasone-Salmeterol (ADVAIR) 250-50 MCG/DOSE AEPB Inhale 1 puff into the lungs 2 (two) times daily.    [provider]    History reviewed. No pertinent family history.   Social History   Tobacco Use  . Smoking status: Never Smoker  . Smokeless tobacco: Never Used  Substance Use Topics  . Alcohol use: Never    Frequency: Never  . Drug use: Never    Allergies as of 07/13/2019 - Review Complete 07/13/2019  Allergen Reaction Noted  . Penicillins Itching, Rash, and Other (See Comments) 07/13/2019    Review of Systems:    All systems reviewed and negative except where noted in HPI.   Physical Exam:  Vital signs in last 24 hours: Temp:  [97.5 F (36.4 C)-98.5 F (36.9  C)] 98.3 F (36.8 C) (09/10 1431) Pulse Rate:  [64-83] 69 (09/10 1600) Resp:  [16-29] 29 (09/10 1600) BP: (135-184)/(42-79) 159/65 (09/10 1600) SpO2:  [92 %-100 %] 96 % (09/10 1600) Weight:  [75 kg-99.8 kg] 75 kg (09/10 0135) Last BM Date: (P) 07/13/19 General:   Pleasant, cooperative in NAD Head:  Normocephalic and atraumatic. Eyes:   No icterus.   Conjunctiva pink. PERRLA. Ears:  Normal auditory acuity. Neck:  Supple; no masses or thyroidomegaly Lungs: Respirations even and unlabored. Lungs clear to auscultation bilaterally.   No wheezes, crackles, or rhonchi.  Heart:  Regular rate and rhythm;  Without murmur, clicks, rubs or gallops Abdomen:  Soft, nondistended, nontender. Normal bowel sounds. No appreciable masses or hepatomegaly.  No  rebound or guarding.  Rectal:  Not performed. Msk:  Symmetrical without gross deformities.    Extremities:  Without edema, cyanosis or clubbing. Neurologic:  Alert and oriented x3;  grossly normal neurologically. Skin:  Intact without significant lesions or rashes. Cervical Nodes:  No significant cervical adenopathy. Psych:  Alert and cooperative. Normal affect.  LAB RESULTS: Recent Labs    07/13/19 1945 07/14/19 0947  WBC 5.9 8.7  HGB 5.7* 8.0*  HCT 18.5* 24.7*  PLT 257 245   BMET Recent Labs    07/13/19 1945 07/14/19 0947  NA 138 139  K 4.3 4.1  CL 104 106  CO2 22 20*  GLUCOSE 218* 166*  BUN 83* 85*  CREATININE 4.96* 4.75*  CALCIUM 10.2 10.2   LFT No results for input(s): PROT, ALBUMIN, AST, ALT, ALKPHOS, BILITOT, BILIDIR, IBILI in the last 72 hours. PT/INR Recent Labs    07/13/19 1945 07/14/19 0947  LABPROT 78.6* 22.1*  INR 10.1* 2.0*    STUDIES: Dg Chest Portable 1 View  Result Date: 07/13/2019 CLINICAL DATA:  Shortness of breath EXAM: PORTABLE CHEST 1 VIEW COMPARISON:  Areae nineteenth 2014 FINDINGS: There is cardiomegaly with overlying median sternotomy wires. There is pulmonary vascular congestion. There is a trace left pleural effusion. Aortic valve calcifications and mitral valve calcifications are noted. No acute osseous abnormality. IMPRESSION: Pulmonary vascular congestion and probable trace left pleural effusion. Electronically Signed   By: Prudencio Pair M.D.   On: 07/13/2019 22:44      Impression / Plan:   Assessment: Active Problems:   Anemia   Laura Kim is a 85 y.o. y/o female with melena and a drop in hemoglobin with super therapeutic INR at 10.  The patient was corrected down to 2.0.  She had a chest x-ray that showed pulmonary vascular congestion with trace left pleural effusion.  The patient has been n.p.o. and was offered to undergo an upper endoscopy today but the patient states that she would rather wait till tomorrow.    Plan:   This patient has melena and likely an upper GI bleed with a hemoglobin on admission at 5.7.  The patient has been transfused and her elevated INR has been brought down to an acceptable level.  The patient will be set up for a EGD for tomorrow.  She will be kept n.p.o. after midnight. I have discussed risks & benefits which include, but are not limited to, bleeding, infection, perforation & drug reaction.  The patient agrees with this plan & written consent will be obtained.     Thank you for involving me in the care of this patient.      LOS: 1 day   Laura Lame, MD  07/14/2019, 5:34 PM  Note: This dictation was prepared with Dragon dictation along with smaller phrase technology. Any transcriptional errors that result from this process are unintentional.

## 2019-07-14 NOTE — Consult Note (Signed)
[facility]  CONSULT NOTE    Date: 07/14/2019                  Patient Name:  Laura Kim  MRN: [id number]  DOB: 06/13/1930  Age / Sex: 85 y.o., female         PCP: Kirk Ruths, MD                 Service Requesting Consult: Dr. Estanislado Pandy                 Reason for Consult: Acute renal failure            History of Present Illness: [name] went to see her PCP on 9/9 where she was found to have shortness of breath. She was found to have low hemoglobin and asked to present to the ED. Hemoglobin 5.7. Give 3 units PRBC. Patient is now short of breath and wheezing. Patient endorses 2 weeks of melana.   She has been taking all her medications as prescribed. 2 weeks of progressive shortness of breath. Screened for COVID-19 (pending). Was initially treated as acute asthma attack.    Medications: Outpatient medications: Medications Prior to Admission  Medication Sig Dispense Refill Last Dose  . amLODipine (NORVASC) 10 MG tablet Take 10 mg by mouth daily.   07/13/2019 at Unknown time  . aspirin 81 MG chewable tablet Chew 81 mg by mouth daily.   07/13/2019 at Unknown time  . benazepril (LOTENSIN) 20 MG tablet Take 20 mg by mouth daily.   07/13/2019 at Unknown time  . insulin glargine (LANTUS) 100 unit/mL SOPN Inject 6 Units into the skin at bedtime.   07/13/2019 at Unknown time  . insulin lispro (HUMALOG KWIKPEN) 100 UNIT/ML KwikPen Inject 6 Units into the skin 2 (two) times daily with a meal.    07/13/2019 at Unknown time  . simvastatin (ZOCOR) 20 MG tablet Take 20 mg by mouth daily.   07/13/2019 at Unknown time  . torsemide (DEMADEX) 10 MG tablet Take 5 mg by mouth daily.   07/13/2019 at Unknown time  . warfarin (COUMADIN) 5 MG tablet Take 5 mg by mouth daily.   07/13/2019 at 1730  . Fluticasone-Salmeterol (ADVAIR) 250-50 MCG/DOSE AEPB Inhale 1 puff into the lungs 2 (two) times daily.       Current medications: Current Facility-Administered  Medications  Medication Dose Route Frequency Provider Last Rate Last Dose  . 0.9 %  sodium chloride infusion   Intravenous Continuous Max Sane, MD   Stopped at 07/14/19 1032  . acetaminophen (TYLENOL) tablet 650 mg  650 mg Oral Q6H PRN Max Sane, MD       Or  . acetaminophen (TYLENOL) suppository 650 mg  650 mg Rectal Q6H PRN Max Sane, MD      . bisacodyl (DULCOLAX) EC tablet 5 mg  5 mg Oral Daily PRN Max Sane, MD      . docusate sodium (COLACE) capsule 100 mg  100 mg Oral BID Max Sane, MD      . HYDROcodone-acetaminophen (NORCO/VICODIN) 5-325 MG per tablet 1-2 tablet  1-2 tablet Oral Q4H PRN Max Sane, MD      . Derrill Memo ON 07/15/2019] influenza vaccine adjuvanted (FLUAD) injection 0.5 mL  0.5 mL Intramuscular Tomorrow-1000 Manuella Ghazi, Vipul, MD      . ondansetron (ZOFRAN) tablet 4 mg  4 mg Oral Q6H PRN Max Sane, MD       Or  . ondansetron (  ZOFRAN) injection 4 mg  4 mg Intravenous Q6H PRN Max Sane, MD      . pantoprazole (PROTONIX) 80 mg in sodium chloride 0.9 % 250 mL (0.32 mg/mL) infusion  8 mg/hr Intravenous Continuous Blake Divine, MD 25 mL/hr at 07/14/19 1054 8 mg/hr at 07/14/19 1054  . [START ON 07/17/2019] pantoprazole (PROTONIX) injection 40 mg  40 mg Intravenous Q12H Blake Divine, MD      . traZODone (DESYREL) tablet 25 mg  25 mg Oral QHS PRN Max Sane, MD          Allergies: Allergies  Allergen Reactions  . Penicillins Itching, Rash and Other (See Comments)    Did it involve swelling of the face/tongue/throat, SOB, or low BP? NO Did it involve sudden or severe rash/hives, skin peeling, or any reaction on the inside of your mouth or nose? Yes Did you need to seek medical attention at a hospital or doctor's office? Unknown When did it last happen?>35 years If all above answers are "NO", may proceed with cephalosporin use.      Past Medical History: Past Medical History:  Diagnosis Date  . Coronary artery disease   . Diabetes mellitus without  complication [facility])      Past Surgical History: Past Surgical History:  Procedure Laterality Date  . ABDOMINAL HYSTERECTOMY    . CARDIAC SURGERY       Family History: History reviewed. No pertinent family history.   Social History: Social History   Socioeconomic History  . Marital status: Married    Spouse name: Not on file  . Number of children: Not on file  . Years of education: Not on file  . Highest education level: Not on file  Occupational History  . Not on file  Social Needs  . Financial resource strain: Not on file  . Food insecurity    Worry: Not on file    Inability: Not on file  . Transportation needs    Medical: Not on file    Non-medical: Not on file  Tobacco Use  . Smoking status: Never Smoker  . Smokeless tobacco: Never Used  Substance and Sexual Activity  . Alcohol use: Never    Frequency: Never  . Drug use: Never  . Sexual activity: Not on file  Lifestyle  . Physical activity    Days per week: Not on file    Minutes per session: Not on file  . Stress: Not on file  Relationships  . Social Herbalist on phone: Not on file    Gets together: Not on file    Attends religious service: Not on file    Active member of club or organization: Not on file    Attends meetings of clubs or organizations: Not on file    Relationship status: Not on file  . Intimate partner violence    Fear of current or ex partner: Not on file    Emotionally abused: Not on file    Physically abused: Not on file    Forced sexual activity: Not on file  Other Topics Concern  . Not on file  Social History Narrative  . Not on file     Review of Systems: Review of Systems  Constitutional: Positive for malaise/fatigue. Negative for chills, diaphoresis, fever and weight loss.  HENT: Negative.  Negative for congestion, ear discharge, ear pain, hearing loss, nosebleeds, sinus pain, sore throat and tinnitus.   Eyes: Negative.  Negative for blurred vision, double  vision, photophobia,  pain, discharge and redness.  Respiratory: Positive for shortness of breath. Negative for cough, hemoptysis, sputum production, wheezing and stridor.   Cardiovascular: Negative.  Negative for chest pain, palpitations, orthopnea, claudication, leg swelling and PND.  Gastrointestinal: Positive for blood in stool and melena. Negative for abdominal pain, constipation, diarrhea, heartburn, nausea and vomiting.  Genitourinary: Negative.  Negative for dysuria, flank pain, frequency, hematuria and urgency.  Musculoskeletal: Negative.  Negative for back pain, falls, joint pain, myalgias and neck pain.  Skin: Negative.  Negative for itching and rash.  Neurological: Negative.  Negative for dizziness, tingling, tremors, sensory change, speech change, focal weakness, seizures, loss of consciousness, weakness and headaches.  Endo/Heme/Allergies: Negative.  Negative for environmental allergies and polydipsia. Does not bruise/bleed easily.  Psychiatric/Behavioral: Negative.  Negative for depression, hallucinations, memory loss, substance abuse and suicidal ideas. The patient is not nervous/anxious and does not have insomnia.     Vital Signs: Blood pressure (!) 159/56, pulse 69, temperature 98.5 F (36.9 C), resp. rate (!) 22, height 5' (1.524 m), weight 75 kg, SpO2 96 %.  Weight trends: Filed Weights   07/13/19 1940 07/14/19 0135  Weight: 99.8 kg 75 kg    Physical Exam: General: In respiratory distress  Head: Normocephalic, atraumatic. Moist oral mucosal membranes  Eyes: Anicteric, PERRL  Neck: Supple, trachea midline  Lungs:  Tachypnic. +bilateral rales  Heart: Regular rate and rhythm  Abdomen:  Soft, nontender,   Extremities: no peripheral edema.  Neurologic: Nonfocal, moving all four extremities  Skin: No lesions  Access: none     Lab results: Basic Metabolic Panel: Recent Labs  Lab 07/13/19 1945 07/14/19 0947  NA 138 139  K 4.3 4.1  CL 104 106  CO2 22 20*   GLUCOSE 218* 166*  BUN 83* 85*  CREATININE 4.96* 4.75*  CALCIUM 10.2 10.2    Liver Function Tests: No results for input(s): AST, ALT, ALKPHOS, BILITOT, PROT, ALBUMIN in the last 168 hours. No results for input(s): LIPASE, AMYLASE in the last 168 hours. No results for input(s): AMMONIA in the last 168 hours.  CBC: Recent Labs  Lab 07/13/19 1945 07/14/19 0947  WBC 5.9 8.7  HGB 5.7* 8.0*  HCT 18.5* 24.7*  MCV 89.8 83.7  PLT 257 245    Cardiac Enzymes: No results for input(s): CKTOTAL, CKMB, CKMBINDEX, TROPONINI in the last 168 hours.  BNP: Invalid input(s): POCBNP  CBG: Recent Labs  Lab 07/14/19 0752  GLUCAP 146*    Microbiology: Results for orders placed or performed during the hospital encounter of 06/02/17  C difficile quick scan w PCR reflex     Status: None   Collection Time: 06/02/17  7:00 AM   Specimen: STOOL  Result Value Ref Range Status   C Diff antigen NEGATIVE NEGATIVE Final   C Diff toxin NEGATIVE NEGATIVE Final   C Diff interpretation No C. difficile detected.  Final  Gastrointestinal Panel by PCR , Stool     Status: None   Collection Time: 06/02/17  7:00 AM   Specimen: STOOL  Result Value Ref Range Status   Campylobacter species NOT DETECTED NOT DETECTED Final   Plesimonas shigelloides NOT DETECTED NOT DETECTED Final   Salmonella species NOT DETECTED NOT DETECTED Final   Yersinia enterocolitica NOT DETECTED NOT DETECTED Final   Vibrio species NOT DETECTED NOT DETECTED Final   Vibrio cholerae NOT DETECTED NOT DETECTED Final   Enteroaggregative E coli (EAEC) NOT DETECTED NOT DETECTED Final   Enteropathogenic E coli (EPEC) NOT DETECTED NOT  DETECTED Final   Enterotoxigenic E coli (ETEC) NOT DETECTED NOT DETECTED Final   Shiga like toxin producing E coli (STEC) NOT DETECTED NOT DETECTED Final   Shigella/Enteroinvasive E coli (EIEC) NOT DETECTED NOT DETECTED Final   Cryptosporidium NOT DETECTED NOT DETECTED Final   Cyclospora cayetanensis NOT  DETECTED NOT DETECTED Final   Entamoeba histolytica NOT DETECTED NOT DETECTED Final   Giardia lamblia NOT DETECTED NOT DETECTED Final   Adenovirus F40/41 NOT DETECTED NOT DETECTED Final   Astrovirus NOT DETECTED NOT DETECTED Final   Norovirus GI/GII NOT DETECTED NOT DETECTED Final   Rotavirus A NOT DETECTED NOT DETECTED Final   Sapovirus (I, II, IV, and V) NOT DETECTED NOT DETECTED Final    Coagulation Studies: Recent Labs    07/13/19 1945 07/14/19 0947  LABPROT 78.6* 22.1*  INR 10.1* 2.0*    Urinalysis: Recent Labs    07/13/19 2219  COLORURINE AMBER*  LABSPEC 1.011  PHURINE 5.0  GLUCOSEU 50*  HGBUR MODERATE*  BILIRUBINUR NEGATIVE  KETONESUR NEGATIVE  PROTEINUR 100*  NITRITE NEGATIVE  LEUKOCYTESUR LARGE*      Imaging: Dg Chest Portable 1 View  Result Date: 07/13/2019 CLINICAL DATA:  Shortness of breath EXAM: PORTABLE CHEST 1 VIEW COMPARISON:  Areae nineteenth 2014 FINDINGS: There is cardiomegaly with overlying median sternotomy wires. There is pulmonary vascular congestion. There is a trace left pleural effusion. Aortic valve calcifications and mitral valve calcifications are noted. No acute osseous abnormality. IMPRESSION: Pulmonary vascular congestion and probable trace left pleural effusion. Electronically Signed   By: Prudencio Pair M.D.   On: 07/13/2019 22:44      Assessment & Plan: Ms. RUTHIA UMFRESS is a 85 y.o. white female with diabetes mellitus type II insulin dependent, coronary artery disease, hypertension, asthma , atrial fibrillation on warfarin, who was admitted to [facility] on 07/13/2019 for Shortness of breath [R06.02] ARF (acute renal failure) (HCC) [N17.9] Upper GI bleed [K92.2] AKI (acute kidney injury) ([place]) [N17.9] Elevated international normalized ratio (INR) [R79.1]   Does not follow with a outpatient nephrologist.  1. Acute renal failure on chronic kidney disease stage IV. Baseline creatinine of 3.6, GFR of 12 on 06/09/19.  Chronic kidney disease  secondary to diabetes mellitus type II.  Acute renal failure secondary acute blood loss.  - No acute indication for dialysis but unfortunately low threshold for dialysis. Discussed with patient. Patient with increased work of breathing.  - IV furosemide.   2. Anemia with renal failure: hemoglobin 5.7 Status post 3 units PRBC. With hypercoaguable state status post vitamin K. FFP has been ordered.  - GI consult pending.   3. Hypertension: elevated.  - holding benazepril due to acute renal failure.   Discussed case with Dr. Patsey Berthold, ICU, and Dr. Estanislado Pandy, hospitalist. Patient will benefit from higher level of care. Especially with increased work of breathing.    LOS: 1 Demitria Hay 9/10/202011:46 AM

## 2019-07-14 NOTE — Progress Notes (Signed)
Report called to Lucilla Edin, RN. Patient transported to ICU in stable condition.

## 2019-07-14 NOTE — ED Notes (Signed)
ED TO INPATIENT HANDOFF REPORT  ED Nurse Name and Phone #: Chieko Neises [phone]  S Name/Age/Gender Laura Kim 85 y.o. female Room/Bed: ED19A/ED19A  Code Status   Code Status: Not on file  Home/SNF/Other Home Patient oriented to: self, place, time and situation Is this baseline? Yes   Triage Complete: Triage complete  Chief Complaint Abnormal Labs  Triage Note Pt to ER states she was seen at PCP today because she was not feeling well.  States she got a call after coming home that she needed to come out to ER due to low blood counts.   Allergies Allergies  Allergen Reactions  . Penicillins Itching, Rash and Other (See Comments)    Did it involve swelling of the face/tongue/throat, SOB, or low BP? NO Did it involve sudden or severe rash/hives, skin peeling, or any reaction on the inside of your mouth or nose? Yes Did you need to seek medical attention at a hospital or doctor's office? Unknown When did it last happen?>35 years If all above answers are "NO", may proceed with cephalosporin use.    Level of Care/Admitting Diagnosis ED Disposition    ED Disposition Condition [facility] Area: [place] [[id number]]  Level of Care: Med-Surg [16]  Covid Evaluation: Asymptomatic Screening Protocol (No Symptoms)  Diagnosis: Anemia [id number]  Admitting Physician: Max Sane [id number]  Attending Physician: Max Sane [id number]  Estimated length of stay: past midnight tomorrow  Certification:: I certify this patient will need inpatient services for at least 2 midnights  PT Class (Do Not Modify): Inpatient [101]  PT Acc Code (Do Not Modify): Private [1]       B Medical/Surgery History Past Medical History:  Diagnosis Date  . Coronary artery disease   . Diabetes mellitus without complication [facility])    Past Surgical History:  Procedure Laterality Date  . ABDOMINAL HYSTERECTOMY    . CARDIAC SURGERY       A IV  Location/Drains/Wounds Patient Lines/Drains/Airways Status   Active Line/Drains/Airways    Name:   Placement date:   Placement time:   Site:   Days:   Peripheral IV 07/14/19 Left Antecubital   07/14/19    0006    Antecubital   less than 1   Peripheral IV 07/13/19 Right Forearm   07/13/19    1130    Forearm   1          Intake/Output Last 24 hours  Intake/Output Summary (Last 24 hours) at 07/14/2019 0018 Last data filed at 07/14/2019 0006 Gross per 24 hour  Intake 320 ml  Output -  Net 320 ml    Labs/Imaging Results for orders placed or performed during the hospital encounter of 07/13/19 (from the past 48 hour(s))  Basic metabolic panel     Status: Abnormal   Collection Time: 07/13/19  7:45 PM  Result Value Ref Range   Sodium 138 135 - 145 mmol/L   Potassium 4.3 3.5 - 5.1 mmol/L   Chloride 104 98 - 111 mmol/L   CO2 22 22 - 32 mmol/L   Glucose, Bld 218 (H) 70 - 99 mg/dL   BUN 83 (H) 8 - 23 mg/dL   Creatinine, Ser 4.96 (H) 0.44 - 1.00 mg/dL   Calcium 10.2 8.9 - 10.3 mg/dL   GFR calc non Af Amer 7 (L) >60 mL/min   GFR calc Af Amer 9 (L) >60 mL/min   Anion gap 12 5 - 15    Comment: Performed at  [facility], [address] [place], [place] [address]  CBC     Status: Abnormal   Collection Time: 07/13/19  7:45 PM  Result Value Ref Range   WBC 5.9 4.0 - 10.5 K/uL   RBC 2.06 (L) 3.87 - 5.11 MIL/uL   Hemoglobin 5.7 (L) 12.0 - 15.0 g/dL   HCT 18.5 (L) 36.0 - 46.0 %   MCV 89.8 80.0 - 100.0 fL   MCH 27.7 26.0 - 34.0 pg   MCHC 30.8 30.0 - 36.0 g/dL   RDW 16.1 (H) 11.5 - 15.5 %   Platelets 257 150 - 400 K/uL   nRBC 0.0 0.0 - 0.2 %    Comment: Performed at [facility], [address] [place], [place] [address]  Protime-INR     Status: Abnormal   Collection Time: 07/13/19  7:45 PM  Result Value Ref Range   Prothrombin Time 78.6 (H) 11.4 - 15.2 seconds   INR 10.1 (HH) 0.8 - 1.2    Comment: CRITICAL RESULT CALLED TO, READ BACK BY AND VERIFIED WITH: AMY  COYNE 07/13/19 @ 2131  [place] (NOTE) INR goal varies based on device and disease states. Performed at [facility], [address] [place], [place] [address]   ABO/Rh     Status: None   Collection Time: 07/13/19  7:45 PM  Result Value Ref Range   ABO/RH(D)      A POS Performed at [facility], [address] [place], [place] [address]   Urinalysis, Complete w Microscopic     Status: Abnormal   Collection Time: 07/13/19 10:19 PM  Result Value Ref Range   Color, Urine AMBER (A) YELLOW    Comment: BIOCHEMICALS MAY BE AFFECTED BY COLOR   APPearance TURBID (A) CLEAR   Specific Gravity, Urine 1.011 1.005 - 1.030   pH 5.0 5.0 - 8.0   Glucose, UA 50 (A) NEGATIVE mg/dL   Hgb urine dipstick MODERATE (A) NEGATIVE   Bilirubin Urine NEGATIVE NEGATIVE   Ketones, ur NEGATIVE NEGATIVE mg/dL   Protein, ur 100 (A) NEGATIVE mg/dL   Nitrite NEGATIVE NEGATIVE   Leukocytes,Ua LARGE (A) NEGATIVE   RBC / HPF 21-50 0 - 5 RBC/hpf   WBC, UA >50 (H) 0 - 5 WBC/hpf   Bacteria, UA RARE (A) NONE SEEN   Squamous Epithelial / LPF 0-5 0 - 5   WBC Clumps PRESENT     Comment: Performed at [facility], [address] [place], [place] [address]  Prepare RBC     Status: None   Collection Time: 07/13/19 10:26 PM  Result Value Ref Range   Order Confirmation      ORDER PROCESSED BY BLOOD BANK Performed at [facility], [address], [place],  [address]   Prepare fresh frozen plasma     Status: None (Preliminary result)   Collection Time: 07/13/19 10:26 PM  Result Value Ref Range   Unit Number [id number]    Blood Component Type THW PLS APHR    Unit division 00    Status of Unit ALLOCATED    Transfusion Status OK TO TRANSFUSE   Type and screen [facility]     Status: None (Preliminary result)   Collection Time: 07/13/19 10:29 PM  Result Value Ref Range   ABO/RH(D) A POS    Antibody Screen NEG    Sample Expiration 07/16/2019,2359     Unit Number [id number]    Blood Component Type RBC LR PHER2  Unit division 00    Status of Unit ISSUED    Transfusion Status OK TO TRANSFUSE    Crossmatch Result      Compatible Performed at [facility], [address] [place] [place], [place] [address]    Unit Number [id number]    Blood Component Type RED CELLS,LR    Unit division 00    Status of Unit ALLOCATED    Transfusion Status OK TO TRANSFUSE    Crossmatch Result Compatible    Dg Chest Portable 1 View  Result Date: 07/13/2019 CLINICAL DATA:  Shortness of breath EXAM: PORTABLE CHEST 1 VIEW COMPARISON:  Areae nineteenth 2014 FINDINGS: There is cardiomegaly with overlying median sternotomy wires. There is pulmonary vascular congestion. There is a trace left pleural effusion. Aortic valve calcifications and mitral valve calcifications are noted. No acute osseous abnormality. IMPRESSION: Pulmonary vascular congestion and probable trace left pleural effusion. Electronically Signed   By: Prudencio Pair M.D.   On: 07/13/2019 22:44    Pending Labs Unresulted Labs (From admission, onward)    Start     Ordered   07/13/19 2228  SARS CORONAVIRUS 2 (TAT 6-24 HRS) Nasopharyngeal Nasopharyngeal Swab  (Asymptomatic/Tier 2 Patients Labs)  Once,   STAT    Question Answer Comment  Is this test for diagnosis or screening Screening   Symptomatic for COVID-19 as defined by CDC No   Hospitalized for COVID-19 No   Admitted to ICU for COVID-19 No   Previously tested for COVID-19 No   Resident in a congregate (group) care setting No   Employed in healthcare setting No   Pregnant No      07/13/19 2227   07/13/19 2225  PTT  Once,   STAT    Comments: PRE TRANSFUSION    07/13/19 2226   Signed and Held  Basic metabolic panel  Tomorrow morning,   R     Signed and Held   Signed and Held  CBC  Tomorrow morning,   R     Signed and Held          Vitals/Pain Today's Vitals   07/13/19 2300 07/13/19 2330 07/14/19 0000 07/14/19 0006  BP:  (!) 155/56 (!) 150/61 (!) 170/62 (!) 170/62  Pulse: 80 72 70 75  Resp: (!) 25 20 (!) 21   Temp:    98.1 F (36.7 C)  TempSrc:    Oral  SpO2: 100% 97% 96% 97%  Weight:      Height:      PainSc:        Isolation Precautions No active isolations  Medications Medications  sodium chloride flush (NS) 0.9 % injection 3 mL (has no administration in time range)  0.9 %  sodium chloride infusion (Manually program via Guardrails IV Fluids) (has no administration in time range)  pantoprazole (PROTONIX) 80 mg in sodium chloride 0.9 % 100 mL IVPB (has no administration in time range)  pantoprazole (PROTONIX) 80 mg in sodium chloride 0.9 % 250 mL (0.32 mg/mL) infusion (has no administration in time range)  pantoprazole (PROTONIX) injection 40 mg (has no administration in time range)  phytonadione (VITAMIN K) 10 mg in dextrose 5 % 50 mL IVPB (0 mg Intravenous Stopped 07/14/19 0017)    Mobility walks Low fall risk   Focused Assessments Cardiac Assessment Handoff:    Lab Results  Component Value Date   CKTOTAL 68 12/22/2012   CKMB 0.9 12/22/2012   TROPONINI < 0.02 12/22/2012   No results found for: DDIMER Does  the Patient currently have chest pain? No     R Recommendations: See Admitting Provider Note  Report given to:   Additional Notes:

## 2019-07-14 NOTE — Progress Notes (Signed)
[place] [facility] [place] NAME: Laura Kim    MR#:  [id number]  DATE OF BIRTH:  1930/03/07  SUBJECTIVE:  CHIEF COMPLAINT:   Chief Complaint  Patient presents with  . Weakness  Patient seen and evaluated today Tolerated PRBC transfusion okay Has shortness of breath Due for FFP transfusion No complaints of chest pain  REVIEW OF SYSTEMS:    ROS  CONSTITUTIONAL: No documented fever. No fatigue, weakness. No weight gain, no weight loss.  EYES: No blurry or double vision.  ENT: No tinnitus. No postnasal drip. No redness of the oropharynx.  RESPIRATORY: No cough, no wheeze, no hemoptysis. Has dyspnea.  CARDIOVASCULAR: No chest pain. No orthopnea. No palpitations. No syncope.  GASTROINTESTINAL: No nausea, no vomiting or diarrhea. No abdominal pain. No melena or hematochezia.  GENITOURINARY: No dysuria or hematuria.  ENDOCRINE: No polyuria or nocturia. No heat or cold intolerance.  HEMATOLOGY: No anemia. No bruising. No bleeding.  INTEGUMENTARY: No rashes. No lesions.  MUSCULOSKELETAL: No arthritis. No swelling. No gout.  NEUROLOGIC: No numbness, tingling, or ataxia. No seizure-type activity.  PSYCHIATRIC: No anxiety. No insomnia. No ADD.   DRUG ALLERGIES:   Allergies  Allergen Reactions  . Penicillins Itching, Rash and Other (See Comments)    Did it involve swelling of the face/tongue/throat, SOB, or low BP? NO Did it involve sudden or severe rash/hives, skin peeling, or any reaction on the inside of your mouth or nose? Yes Did you need to seek medical attention at a hospital or doctor's office? Unknown When did it last happen?>35 years If all above answers are "NO", may proceed with cephalosporin use.    VITALS:  Blood pressure (!) 159/56, pulse 69, temperature 98.5 F (36.9 C), resp. rate (!) 22, height 5' (1.524 m), weight 75 kg, SpO2 96 %.  PHYSICAL EXAMINATION:   Physical Exam  GENERAL:  85 y.o.-year-old patient lying in  the bed with no acute distress.  EYES: Pupils equal, round, reactive to light and accommodation. No scleral icterus. Extraocular muscles intact.  HEENT: Head atraumatic, normocephalic. Oropharynx and nasopharynx clear.  NECK:  Supple, no jugular venous distention. No thyroid enlargement, no tenderness.  LUNGS: Decreased breath sounds bilaterally,basal rales heard. No use of accessory muscles of respiration.  CARDIOVASCULAR: S1, S2 normal. No murmurs, rubs, or gallops.  ABDOMEN: Soft, nontender, nondistended. Bowel sounds present. No organomegaly or mass.  EXTREMITIES: No cyanosis, clubbing or edema b/l.    NEUROLOGIC: Cranial nerves II through XII are intact. No focal Motor or sensory deficits b/l.   PSYCHIATRIC: The patient is alert and oriented x 3.  SKIN: No obvious rash, lesion, or ulcer.   LABORATORY PANEL:   CBC Recent Labs  Lab 07/14/19 0947  WBC 8.7  HGB 8.0*  HCT 24.7*  PLT 245   ------------------------------------------------------------------------------------------------------------------ Chemistries  Recent Labs  Lab 07/14/19 0947  NA 139  K 4.1  CL 106  CO2 20*  GLUCOSE 166*  BUN 85*  CREATININE 4.75*  CALCIUM 10.2   ------------------------------------------------------------------------------------------------------------------  Cardiac Enzymes No results for input(s): TROPONINI in the last 168 hours. ------------------------------------------------------------------------------------------------------------------  RADIOLOGY:  Dg Chest Portable 1 View  Result Date: 07/13/2019 CLINICAL DATA:  Shortness of breath EXAM: PORTABLE CHEST 1 VIEW COMPARISON:  Areae nineteenth 2014 FINDINGS: There is cardiomegaly with overlying median sternotomy wires. There is pulmonary vascular congestion. There is a trace left pleural effusion. Aortic valve calcifications and mitral valve calcifications are noted. No acute osseous abnormality. IMPRESSION: Pulmonary vascular  congestion and probable trace left pleural effusion. Electronically Signed   By: Prudencio Pair M.D.   On: 07/13/2019 22:44     ASSESSMENT AND PLAN:   86 year old elderly female patient with history of coronary disease, chronic atrial fibrillation on Coumadin, and type 2 diabetes mellitus currently under hospitalist service  -Coumadin coagulopathy Patient already received vitamin K Due for FFP transfusion Follow-up INR  -Anemia secondary to blood loss PRBC transfusions Hemoglobin greater than 8 Continue IV PPI Follow-up GI consult  -Acute on chronic kidney disease stage IV Nephrology consultation and renal ultrasound May need dialysis ,will leave the decision to nephrology  -Atrial fibrillation chronic Coumadin on hold secondary to coagulopathy Rate well controlled  -Dyspnea probably secondary to volume overload IV Lasix given  All the records are reviewed and case discussed with Care Management/Social Worker. Management plans discussed with the patient, family and they are in agreement.  CODE STATUS: Full code  DVT Prophylaxis: SCDs  TOTAL TIME TAKING CARE OF THIS PATIENT: 36 minutes.   POSSIBLE D/C IN 2 to 3 DAYS, DEPENDING ON CLINICAL CONDITION.  Saundra Shelling M.D on 07/14/2019 at 1:30 PM  Between 7am to 6pm - Pager - [phone]  After 6pm go to [email or web address] - password [email or web address] [facility]  Office  [phone]  CC: Primary care physician; Kirk Ruths, MD  Note: This dictation was prepared with Dragon dictation along with smaller phrase technology. Any transcriptional errors that result from this process are unintentional.

## 2019-07-14 NOTE — Plan of Care (Signed)
  Problem: Education: Goal: Knowledge of General Education information will improve Description Including pain rating scale, medication(s)/side effects and non-pharmacologic comfort measures Outcome: Progressing   

## 2019-07-14 NOTE — Progress Notes (Signed)
*  PRELIMINARY RESULTS* Echocardiogram 2D Echocardiogram has been performed.  Sherrie Sport 07/14/2019, 4:37 PM

## 2019-07-14 NOTE — Progress Notes (Signed)
   07/14/19 2100  Clinical Encounter Type  Visited With Patient  Visit Type Initial;Spiritual support;Critical Care;Other (Comment)  Referral From Patient  Spiritual Encounters  Spiritual Needs Prayer;Emotional;Other (Comment)  Stress Factors  Patient Stress Factors Loss;Other (Comment)

## 2019-07-14 NOTE — Consult Note (Signed)
Name: Laura Kim MRN: [id number] DOB: [birth year]    ADMISSION DATE:  07/13/2019 CONSULTATION DATE: 07/14/2019  REFERRING MD : Dr. Estanislado Pandy   CHIEF COMPLAINT: Weakness   BRIEF PATIENT DESCRIPTION:  85 yo female admitted to the medsurg unit on 09/9 with acute respiratory failure secondary to vascular congestion in setting of acute on chronic renal failure and acute GI bleed pt on coumadin due to paroxysmal atrial fibrillation.  Required transfer to the stepdown unit 09/10 due to worsening respiratory failure and acute on chronic renal failure   SIGNIFICANT EVENTS/STUDIES:  09/9-Pt admitted to the [facility] unit with acute GI bleed  09/10-Pt developed worsening acute on chronic renal failure and acute respiratory failure requiring transfer to the stepdown unit.  Nephrology consulted recommended iv lasix   HISTORY OF PRESENT ILLNESS:   This is an 85 yo female with a PMH of Type II Diabetes Mellitus, CAD, CABG, HTN, Paroxysmal Atrial Fibrillation, Peripheral Vascular Disease, Hyperlipidemia, Stage IV CKD, and Dilated Cardiomyopathy.  On 09/9 she saw her PCP due to worsening generalized weakness and mild shortness of breath.  Labs at PCP office revealed PT 130 and INR >10 (pt takes coumadin) pt instructed to proceed to the ER for additional treatment.  Upon presentation to [facility] ER pt endorsed having dark black stools and shortness of breath, onset 1 week prior to presentation.  Lab results revealed glucose 218, BUN 83, creatinine 4.96, hgb 5.7, PT 78.6, INR 10.1, and UA positive for UTI.  CXR concerning for vascular congestion.  She received 10 mg iv vitamin K, protonix gtt, 2 units pRBC's, and 1 unit FFP.  Pt subsequently admitted to the medsurg unit per hospitalist team for additional workup and treatment GI consulted.  On 09/10 pt developed worsening acute respiratory failure likely secondary to vascular congestion in setting of worsening acute on chronic renal failure nephrology consulted.   PAST MEDICAL HISTORY :   has a past medical history of Coronary artery disease and Diabetes mellitus without complication ([place]).  has a past surgical history that includes Cardiac surgery and Abdominal hysterectomy. Prior to Admission medications   Medication Sig Start Date End Date Taking? Authorizing Provider  amLODipine (NORVASC) 10 MG tablet Take 10 mg by mouth daily.   Yes [provider]  aspirin 81 MG chewable tablet Chew 81 mg by mouth daily.   Yes [provider]  benazepril (LOTENSIN) 20 MG tablet Take 20 mg by mouth daily.   Yes [provider]  insulin glargine (LANTUS) 100 unit/mL SOPN Inject 6 Units into the skin at bedtime.   Yes [provider]  insulin lispro (HUMALOG KWIKPEN) 100 UNIT/ML KwikPen Inject 6 Units into the skin 2 (two) times daily with a meal.    Yes [provider]  simvastatin (ZOCOR) 20 MG tablet Take 20 mg by mouth daily.   Yes [provider]  torsemide (DEMADEX) 10 MG tablet Take 5 mg by mouth daily.   Yes [provider]  warfarin (COUMADIN) 5 MG tablet Take 5 mg by mouth daily.   Yes [provider]  Fluticasone-Salmeterol (ADVAIR) 250-50 MCG/DOSE AEPB Inhale 1 puff into the lungs 2 (two) times daily.    [provider]   Allergies  Allergen Reactions  . Penicillins Itching, Rash and Other (See Comments)    Did it involve swelling of the face/tongue/throat, SOB, or low BP? NO Did it involve sudden or severe rash/hives, skin peeling, or any reaction on the inside of your mouth or  nose? Yes Did you need to seek medical attention at a hospital or doctor's office? Unknown When did it last happen?>35 years If all above answers are "NO", may proceed with cephalosporin use.    FAMILY HISTORY:  family history is not on file. SOCIAL HISTORY:  reports that she has never smoked. She has never used smokeless tobacco. She reports that she does not drink alcohol or use drugs.   REVIEW OF SYSTEMS: Positives in BOLD  Constitutional: Negative for fever, chills, weight loss, malaise/fatigue and diaphoresis.  HENT: Negative for hearing loss, ear pain, nosebleeds, congestion, sore throat, neck pain, tinnitus and ear discharge.   Eyes: Negative for blurred vision, double vision, photophobia, pain, discharge and redness.  Respiratory: cough, hemoptysis, sputum production, shortness of breath, wheezing and stridor.   Cardiovascular: Negative for chest pain, palpitations, orthopnea, claudication, leg swelling and PND.  Gastrointestinal: Negative for heartburn, nausea, vomiting, abdominal pain, diarrhea, constipation, blood in stool and melena.  Genitourinary: Negative for dysuria, urgency, frequency, hematuria and flank pain.  Musculoskeletal: Negative for myalgias, back pain, joint pain and falls.  Skin: Negative for itching and rash.  Neurological: dizziness, tingling, tremors, sensory change, speech change, focal weakness, seizures, loss of consciousness, weakness and headaches.  Endo/Heme/Allergies: Negative for environmental allergies and polydipsia. Does not bruise/bleed easily.  SUBJECTIVE:  No complaints at this time   VITAL SIGNS: Temp:  [97.5 F (36.4 C)-98.5 F (36.9 C)] 98.5 F (36.9 C) (09/10 1050) Pulse Rate:  [64-83] 69 (09/10 1050) Resp:  [16-25] 22 (09/10 1050) BP: (135-180)/(42-79) 159/56 (09/10 1050) SpO2:  [92 %-100 %] 96 % (09/10 1014) Weight:  [75 kg-99.8 kg] 75 kg (09/10 0135)  PHYSICAL EXAMINATION: General: well developed, well nourished female,  Neuro: alert and oriented, follows commands  HEENT: supple, mild JVD Cardiovascular: irregular irregular, no R/G  Lungs: faint crackles throughout, even, non labored  Abdomen: +BS x4, soft, non tender, non distended  Musculoskeletal: trace bilateral lower extremity edema  Skin: left orbital ecchymotic area   Recent Labs  Lab 07/13/19 1945 07/14/19 0947  NA 138 139  K 4.3 4.1  CL 104 106   CO2 22 20*  BUN 83* 85*  CREATININE 4.96* 4.75*  GLUCOSE 218* 166*   Recent Labs  Lab 07/13/19 1945 07/14/19 0947  HGB 5.7* 8.0*  HCT 18.5* 24.7*  WBC 5.9 8.7  PLT 257 245   Dg Chest Portable 1 View  Result Date: 07/13/2019 CLINICAL DATA:  Shortness of breath EXAM: PORTABLE CHEST 1 VIEW COMPARISON:  Areae nineteenth 2014 FINDINGS: There is cardiomegaly with overlying median sternotomy wires. There is pulmonary vascular congestion. There is a trace left pleural effusion. Aortic valve calcifications and mitral valve calcifications are noted. No acute osseous abnormality. IMPRESSION: Pulmonary vascular congestion and probable trace left pleural effusion. Electronically Signed   By: Prudencio Pair M.D.   On: 07/13/2019 22:44    ASSESSMENT / PLAN:  Acute respiratory failure secondary to vascular congestion in setting of worsening acute on chronic renal failure Prn supplemental O2 for dyspnea and/or hypoxia  Nephrology consulted appreciate input-iv lasix 40 mg q12hrs for now, no acute indication for dialysis however low threshold for HD  Trend BMP  Replace electrolytes as indicated  Monitor UOP Avoid nephrotoxic medications  Renal ultrasound pending   Hypertension  Hx: Paroxysmal atrial fibrillation, CAD, CABG, and HLD Continuous telemetry monitoring  Troponin pending  Hold outpatient amlodipine, aspirin, benazepril, simvastatin, and torsemide  Anemia secondary to acute GI bleed (on admission PT 78.6 and  INR 10.1 pt on coumadin due to paroxysmal atrial fibrillation hx)  VTE px: SCD's, hold outpatient coumadin   Serial H&H Trend coags  Transfuse for hgb <7 and/or signs of active bleeding  Continue protonix gtt  Gastroenterology consulted appreciate input  Keep NPO for now   Type II diabetes mellitus  CBG's q4hrs  SSI   Marda Stalker, [place] Pager [phone] (please enter 7 digits) PCCM Consult Pager [phone] (please enter 7 digits)

## 2019-07-14 NOTE — Progress Notes (Signed)
Advanced care plan. Purpose of the Encounter: CODE STATUS Parties in Attendance:Patient Patient's Decision Capacity:Good Subjective/Patient's story: Laura Kim  is a 85 y.o. female with a known history of CAD, A. fib on Coumadin, and diabetes who presents to the ED complaining of generalized weakness. Patient reports that she has been feeling increasingly weak over the past 1 to 2 weeks. She was seen by her PCP who referred her to the ED for abnormal labs/low hemoglobin.  Her hemoglobin was 5.7 in the emergency department.  She was ordered 2 unit of packed red blood cell transfusion while in ED.Patient states that she has noticed dark black stools over about the past week and has been getting out of breath very easily. She denies any pain in her chest and has not had any fevers, chills, or cough. Objective/Medical story Patient needs PRBC transfusion and Coumadin to be held Needs vitamin K and FFP transfusion Needs gastroenterology evaluation Needs nephrology evaluation for renal failure and probably dialysis if kidney functions do not improve Goals of care determination:  Advance care directives treatment plan discussed She wants everything done which includes CPR, intubation ventilator if the need arises CODE STATUS: Full code Time spent discussing advanced care planning: 16 minutes

## 2019-07-15 ENCOUNTER — Encounter: Payer: Self-pay | Admitting: *Deleted

## 2019-07-15 ENCOUNTER — Inpatient Hospital Stay: Payer: Medicare Other | Admitting: Registered Nurse

## 2019-07-15 ENCOUNTER — Inpatient Hospital Stay: Payer: Medicare Other

## 2019-07-15 ENCOUNTER — Other Ambulatory Visit: Payer: Self-pay

## 2019-07-15 ENCOUNTER — Encounter
Admission: EM | Disposition: A | Discharge status: Home Health | Payer: Self-pay | Source: Home / Self Care | Attending: Internal Medicine

## 2019-07-15 DIAGNOSIS — J9601 Acute respiratory failure with hypoxia: Secondary | ICD-10-CM

## 2019-07-15 DIAGNOSIS — K31819 Angiodysplasia of stomach and duodenum without bleeding: Secondary | ICD-10-CM

## 2019-07-15 DIAGNOSIS — K921 Melena: Secondary | ICD-10-CM

## 2019-07-15 HISTORY — PX: ESOPHAGOGASTRODUODENOSCOPY (EGD) WITH PROPOFOL: SHX5813

## 2019-07-15 LAB — GLUCOSE, CAPILLARY
Glucose-Capillary: 138 mg/dL — ABNORMAL HIGH (ref 70–99)
Glucose-Capillary: 196 mg/dL — ABNORMAL HIGH (ref 70–99)
Glucose-Capillary: 213 mg/dL — ABNORMAL HIGH (ref 70–99)

## 2019-07-15 LAB — BPAM RBC
Blood Product Expiration Date: 202009172359
Blood Product Expiration Date: 202010072359
ISSUE DATE / TIME: 202009092358
ISSUE DATE / TIME: 202009100333
Unit Type and Rh: 600
Unit Type and Rh: 6200

## 2019-07-15 LAB — CBC WITH DIFFERENTIAL/PLATELET
Abs Immature Granulocytes: 0.03 10*3/uL (ref 0.00–0.07)
Basophils Absolute: 0 10*3/uL (ref 0.0–0.1)
Basophils Relative: 0 %
Eosinophils Absolute: 0 10*3/uL (ref 0.0–0.5)
Eosinophils Relative: 0 %
HCT: 26.4 % — ABNORMAL LOW (ref 36.0–46.0)
Hemoglobin: 8.2 g/dL — ABNORMAL LOW (ref 12.0–15.0)
Immature Granulocytes: 0 %
Lymphocytes Relative: 9 %
Lymphs Abs: 0.8 10*3/uL (ref 0.7–4.0)
MCH: 26.6 pg (ref 26.0–34.0)
MCHC: 31.1 g/dL (ref 30.0–36.0)
MCV: 85.7 fL (ref 80.0–100.0)
Monocytes Absolute: 0.9 10*3/uL (ref 0.1–1.0)
Monocytes Relative: 10 %
Neutro Abs: 7.2 10*3/uL (ref 1.7–7.7)
Neutrophils Relative %: 81 %
Platelets: 289 10*3/uL (ref 150–400)
RBC: 3.08 MIL/uL — ABNORMAL LOW (ref 3.87–5.11)
RDW: 18.6 % — ABNORMAL HIGH (ref 11.5–15.5)
WBC: 9.1 10*3/uL (ref 4.0–10.5)
nRBC: 0 % (ref 0.0–0.2)

## 2019-07-15 LAB — BPAM FFP
Blood Product Expiration Date: 202009142359
ISSUE DATE / TIME: 202009101018
Unit Type and Rh: 6200

## 2019-07-15 LAB — PROTIME-INR
INR: 1.3 — ABNORMAL HIGH (ref 0.8–1.2)
Prothrombin Time: 16.2 seconds — ABNORMAL HIGH (ref 11.4–15.2)

## 2019-07-15 LAB — TYPE AND SCREEN
ABO/RH(D): A POS
Antibody Screen: NEGATIVE
Unit division: 0
Unit division: 0

## 2019-07-15 LAB — PREPARE FRESH FROZEN PLASMA: Unit division: 0

## 2019-07-15 LAB — PROTEIN / CREATININE RATIO, URINE
Creatinine, Urine: 52 mg/dL
Protein Creatinine Ratio: 2.44 mg/mg{Cre} — ABNORMAL HIGH (ref 0.00–0.15)
Total Protein, Urine: 127 mg/dL

## 2019-07-15 LAB — ECHOCARDIOGRAM COMPLETE
Height: 60 in
Weight: 2645.52 [oz_av]

## 2019-07-15 LAB — PHOSPHORUS: Phosphorus: 3.9 mg/dL (ref 2.5–4.6)

## 2019-07-15 LAB — MAGNESIUM: Magnesium: 2.3 mg/dL (ref 1.7–2.4)

## 2019-07-15 SURGERY — ESOPHAGOGASTRODUODENOSCOPY (EGD) WITH PROPOFOL
Anesthesia: General

## 2019-07-15 MED ORDER — LIDOCAINE HCL (PF) 2 % IJ SOLN
INTRAMUSCULAR | Status: AC
  Filled 2019-07-15: qty 10

## 2019-07-15 MED ORDER — INSULIN ASPART 100 UNIT/ML ~~LOC~~ SOLN
0.0000 [IU] | Freq: Three times a day (TID) | SUBCUTANEOUS | Status: DC
  Administered 2019-07-15: 2 [IU] via SUBCUTANEOUS
  Administered 2019-07-16: 1 [IU] via SUBCUTANEOUS
  Administered 2019-07-16: 3 [IU] via SUBCUTANEOUS
  Administered 2019-07-16: 09:00:00 1 [IU] via SUBCUTANEOUS
  Administered 2019-07-16: 2 [IU] via SUBCUTANEOUS
  Administered 2019-07-17 (×2): 1 [IU] via SUBCUTANEOUS
  Administered 2019-07-17: 2 [IU] via SUBCUTANEOUS
  Administered 2019-07-17: 3 [IU] via SUBCUTANEOUS
  Administered 2019-07-18 (×2): 2 [IU] via SUBCUTANEOUS
  Administered 2019-07-18: 09:00:00 1 [IU] via SUBCUTANEOUS
  Administered 2019-07-18: 17:00:00 2 [IU] via SUBCUTANEOUS
  Administered 2019-07-19 (×2): 1 [IU] via SUBCUTANEOUS
  Administered 2019-07-19 (×2): 2 [IU] via SUBCUTANEOUS
  Administered 2019-07-20: 1 [IU] via SUBCUTANEOUS
  Administered 2019-07-20: 13:00:00 2 [IU] via SUBCUTANEOUS
  Filled 2019-07-15 (×19): qty 1

## 2019-07-15 MED ORDER — LIDOCAINE HCL (CARDIAC) PF 100 MG/5ML IV SOSY
PREFILLED_SYRINGE | INTRAVENOUS | Status: DC | PRN
  Administered 2019-07-15: 50 mg via INTRATRACHEAL

## 2019-07-15 MED ORDER — SODIUM CHLORIDE 0.9 % IV SOLN
1.0000 g | Freq: Every day | INTRAVENOUS | Status: DC
  Filled 2019-07-15: qty 10

## 2019-07-15 MED ORDER — CEFDINIR 300 MG PO CAPS
300.0000 mg | ORAL_CAPSULE | ORAL | Status: AC
  Administered 2019-07-15 – 2019-07-17 (×2): 300 mg via ORAL
  Filled 2019-07-15 (×2): qty 1

## 2019-07-15 MED ORDER — PROPOFOL 10 MG/ML IV BOLUS
INTRAVENOUS | Status: DC | PRN
  Administered 2019-07-15: 50 mg via INTRAVENOUS

## 2019-07-15 MED ORDER — PROPOFOL 500 MG/50ML IV EMUL
INTRAVENOUS | Status: DC | PRN
  Administered 2019-07-15: 150 ug/kg/min via INTRAVENOUS

## 2019-07-15 MED ORDER — AZITHROMYCIN 250 MG PO TABS
500.0000 mg | ORAL_TABLET | Freq: Every day | ORAL | Status: AC
  Administered 2019-07-15 – 2019-07-19 (×5): 500 mg via ORAL
  Filled 2019-07-15: qty 2
  Filled 2019-07-15: qty 1
  Filled 2019-07-15 (×3): qty 2

## 2019-07-15 MED ORDER — PANTOPRAZOLE SODIUM 40 MG PO TBEC
40.0000 mg | DELAYED_RELEASE_TABLET | Freq: Two times a day (BID) | ORAL | Status: DC
  Administered 2019-07-15 – 2019-07-20 (×10): 40 mg via ORAL
  Filled 2019-07-15 (×10): qty 1

## 2019-07-15 MED ORDER — FUROSEMIDE 40 MG PO TABS
40.0000 mg | ORAL_TABLET | Freq: Two times a day (BID) | ORAL | Status: DC
  Administered 2019-07-15 – 2019-07-16 (×2): 40 mg via ORAL
  Filled 2019-07-15 (×2): qty 1

## 2019-07-15 MED ORDER — PROPOFOL 10 MG/ML IV BOLUS
INTRAVENOUS | Status: AC
  Filled 2019-07-15: qty 20

## 2019-07-15 MED ORDER — INFLUENZA VAC A&B SA ADJ QUAD 0.5 ML IM PRSY
0.5000 mL | PREFILLED_SYRINGE | INTRAMUSCULAR | Status: AC
  Administered 2019-07-19: 0.5 mL via INTRAMUSCULAR
  Filled 2019-07-15 (×2): qty 0.5

## 2019-07-15 MED ORDER — IPRATROPIUM-ALBUTEROL 0.5-2.5 (3) MG/3ML IN SOLN
RESPIRATORY_TRACT | Status: AC
  Administered 2019-07-15: 3 mL via RESPIRATORY_TRACT
  Filled 2019-07-15: qty 3

## 2019-07-15 NOTE — Progress Notes (Signed)
Upon the start of my shift the patient was very irritable.  She stated that she wanted to be left alone.  Patient was left alone at that time.  Soon after, she pulled out her IV, ripped off all of her electrodes, pulse ox and got out of bed.  Patient refused requests from multiple nurses to help her out.  Patient also refused for this RN to complete her morning assessment at this time.  Will attempt again at a later time.  Dr. Alva Garnet notified of the patient's condition.

## 2019-07-15 NOTE — Progress Notes (Signed)
[facility]  ROUNDING NOTE   Subjective:   Moved to ICU  Patient walking around the room  No new renal function panel today.   Objective:  Vital signs in last 24 hours:  Temp:  [98 F (36.7 C)-98.5 F (36.9 C)] 98.4 F (36.9 C) (09/11 0400) Pulse Rate:  [64-74] 74 (09/11 0400) Resp:  [22-52] 52 (09/11 0400) BP: (143-184)/(35-74) 154/55 (09/11 0400) SpO2:  [93 %-100 %] 93 % (09/11 0913) Weight:  [74 kg] 74 kg (09/11 0403)  Weight change: -25.8 kg Filed Weights   07/13/19 1940 07/14/19 0135 07/15/19 0403  Weight: 99.8 kg 75 kg 74 kg    Intake/Output: I/O last 3 completed shifts: In: 2059.2 [I.V.:828.3; Blood:1140.9; IV Piggyback:90] Out: 1250 [Urine:1250]   Intake/Output this shift:  No intake/output data recorded.  Physical Exam: General: NAD, sitting up in bed  Head: Normocephalic, atraumatic. Moist oral mucosal membranes  Eyes: Anicteric, PERRL  Neck: Supple, trachea midline  Lungs:  Bilateral wheezing.   Heart: Regular rate and rhythm  Abdomen:  Soft, nontender  Extremities: trace peripheral edema.  Neurologic: Nonfocal, moving all four extremities  Skin: No lesions        Basic Metabolic Panel: Recent Labs  Lab 07/13/19 1945 07/14/19 0947  NA 138 139  K 4.3 4.1  CL 104 106  CO2 22 20*  GLUCOSE 218* 166*  BUN 83* 85*  CREATININE 4.96* 4.75*  CALCIUM 10.2 10.2    Liver Function Tests: No results for input(s): AST, ALT, ALKPHOS, BILITOT, PROT, ALBUMIN in the last 168 hours. No results for input(s): LIPASE, AMYLASE in the last 168 hours. No results for input(s): AMMONIA in the last 168 hours.  CBC: Recent Labs  Lab 07/13/19 1945 07/14/19 0947 07/14/19 1751 07/14/19 2310  WBC 5.9 8.7  --   --   HGB 5.7* 8.0* 7.9* 7.8*  HCT 18.5* 24.7* 24.7* 24.5*  MCV 89.8 83.7  --   --   PLT 257 245  --   --     Cardiac Enzymes: No results for input(s): CKTOTAL, CKMB, CKMBINDEX, TROPONINI in the last 168 hours.  BNP: Invalid  input(s): POCBNP  CBG: Recent Labs  Lab 07/14/19 0752 07/14/19 1430 07/14/19 2133 07/15/19 0728  GLUCAP 146* 159* 11 138*    Microbiology: Results for orders placed or performed during the hospital encounter of 07/13/19  SARS CORONAVIRUS 2 (TAT 6-24 HRS) Nasopharyngeal Nasopharyngeal Swab     Status: None   Collection Time: 07/13/19 10:42 PM   Specimen: Nasopharyngeal Swab  Result Value Ref Range Status   SARS Coronavirus 2 NEGATIVE NEGATIVE Final    Comment: (NOTE) SARS-CoV-2 target nucleic acids are NOT DETECTED. The SARS-CoV-2 RNA is generally detectable in upper and lower respiratory specimens during the acute phase of infection. Negative results do not preclude SARS-CoV-2 infection, do not rule out co-infections with other pathogens, and should not be used as the sole basis for treatment or other patient management decisions. Negative results must be combined with clinical observations, patient history, and epidemiological information. The expected result is Negative. Fact Sheet for Patients: SugarRoll.be Fact Sheet for Healthcare Providers: https://www.woods-mathews.com/ This test is not yet approved or cleared by the [place] FDA and  has been authorized for detection and/or diagnosis of SARS-CoV-2 by FDA under an Emergency Use Authorization (EUA). This EUA will remain  in effect (meaning this test can be used) for the duration of the COVID-19 declaration under Section 56 4(b)(1) of the Act, 21 U.S.C. section  360bbb-3(b)(1), unless the authorization is terminated or revoked sooner. Performed at [facility], [place] [address] [place], [place] [address]   MRSA PCR Screening     Status: None   Collection Time: 07/14/19  2:28 PM  Result Value Ref Range Status   MRSA by PCR NEGATIVE NEGATIVE Final    Comment:        The GeneXpert MRSA Assay (FDA approved for NASAL specimens only), is one component of  a comprehensive MRSA colonization surveillance program. It is not intended to diagnose MRSA infection nor to guide or monitor treatment for MRSA infections. Performed at [facility], [address] [place], [place] [address]     Coagulation Studies: Recent Labs    07/13/19 1945 07/14/19 0947  LABPROT 78.6* 22.1*  INR 10.1* 2.0*    Urinalysis: Recent Labs    07/13/19 2219  COLORURINE AMBER*  LABSPEC 1.011  PHURINE 5.0  GLUCOSEU 50*  HGBUR MODERATE*  BILIRUBINUR NEGATIVE  KETONESUR NEGATIVE  PROTEINUR 100*  NITRITE NEGATIVE  LEUKOCYTESUR LARGE*      Imaging: Dg Chest Port 1 View  Result Date: 07/15/2019 CLINICAL DATA:  Acute respiratory failure EXAM: PORTABLE CHEST 1 VIEW COMPARISON:  07/13/2019 FINDINGS: Cardiac shadow is enlarged. Postsurgical changes are again seen and stable. Aortic calcifications are again noted. Lungs are well aerated bilaterally. Increased patchy infiltrate is noted in the right lung base medially. Chronic scarring in the left base is noted. IMPRESSION: New right basilar infiltrate Electronically Signed   By: Inez Catalina M.D.   On: 07/15/2019 08:38   Dg Chest Portable 1 View  Result Date: 07/13/2019 CLINICAL DATA:  Shortness of breath EXAM: PORTABLE CHEST 1 VIEW COMPARISON:  Areae nineteenth 2014 FINDINGS: There is cardiomegaly with overlying median sternotomy wires. There is pulmonary vascular congestion. There is a trace left pleural effusion. Aortic valve calcifications and mitral valve calcifications are noted. No acute osseous abnormality. IMPRESSION: Pulmonary vascular congestion and probable trace left pleural effusion. Electronically Signed   By: Prudencio Pair M.D.   On: 07/13/2019 22:44     Medications:   . sodium chloride Stopped (07/14/19 1032)  . pantoprozole (PROTONIX) infusion 8 mg/hr (07/15/19 0402)   . azithromycin  500 mg Oral Daily  . cefdinir  300 mg Oral QODAY  . Chlorhexidine Gluconate Cloth  6 each Topical  Daily  . docusate sodium  100 mg Oral BID  . furosemide  40 mg Oral BID  . influenza vaccine adjuvanted  0.5 mL Intramuscular Tomorrow-1000  . insulin aspart  0-9 Units Subcutaneous Q4H  . ipratropium-albuterol  3 mL Nebulization Q6H WA  . [START ON 07/17/2019] pantoprazole  40 mg Intravenous Q12H   acetaminophen **OR** acetaminophen, bisacodyl, hydrALAZINE, HYDROcodone-acetaminophen, ondansetron **OR** ondansetron (ZOFRAN) IV, traZODone  Assessment/ Plan:  [name] is a 85 y.o. white female with diabetes mellitus type II insulin dependent, coronary artery disease, hypertension, asthma , atrial fibrillation on warfarin, who was admitted to [facility] on 07/13/2019 for hypercoaguable state with GI bleed and symptomatic anemia.   Does not follow with a outpatient nephrologist.  1. Acute renal failure on chronic kidney disease stage IV. Baseline creatinine of 3.6, GFR of 12 on 06/09/19.  Chronic kidney disease secondary to diabetes mellitus type II.  Acute renal failure secondary acute blood loss.  - No acute indication for dialysis but unfortunately low threshold for dialysis.  - Pending renal ultrasound - IV furosemide.   2. Anemia with renal failure:  Status post 3  units PRBC and FFP. With hypercoaguable state status post vitamin K. FFP has been ordered.  - EGD scheduled for today  3. Hypertension: elevated.  - holding benazepril due to acute renal failure.    LOS: 2 Odette Watanabe 9/11/202010:36 AM

## 2019-07-15 NOTE — Anesthesia Preprocedure Evaluation (Addendum)
Anesthesia Evaluation  Patient identified by MRN, date of birth, ID band Patient awake    Reviewed: Allergy & Precautions, H&P , NPO status , Patient's Chart, lab work & pertinent test results, reviewed documented beta blocker date and time   Airway Mallampati: II   Neck ROM: full    Dental  (+) Poor Dentition   Pulmonary neg pulmonary ROS,    Pulmonary exam normal        Cardiovascular Exercise Tolerance: Poor + CAD and + CABG  Atrial Fibrillation  Rhythm:regular Rate:Normal     Neuro/Psych negative neurological ROS  negative psych ROS   GI/Hepatic negative GI ROS, Neg liver ROS,   Endo/Other  negative endocrine ROSdiabetes, Well Controlled, Type 1, Insulin Dependent  Renal/GU negative Renal ROS  negative genitourinary   Musculoskeletal   Abdominal   Peds  Hematology  (+) Blood dyscrasia, anemia ,   Anesthesia Other Findings Past Medical History: No date: Coronary artery disease No date: Diabetes mellitus without complication (HCC) Past Surgical History: No date: ABDOMINAL HYSTERECTOMY No date: CARDIAC SURGERY BMI    Body Mass Index: 31.86 kg/m     Reproductive/Obstetrics negative OB ROS                             Anesthesia Physical Anesthesia Plan  ASA: IV  Anesthesia Plan: General   Post-op Pain Management:    Induction:   PONV Risk Score and Plan:   Airway Management Planned:   Additional Equipment:   Intra-op Plan:   Post-operative Plan:   Informed Consent: I have reviewed the patients History and Physical, chart, labs and discussed the procedure including the risks, benefits and alternatives for the proposed anesthesia with the patient or authorized representative who has indicated his/her understanding and acceptance.     Dental Advisory Given  Plan Discussed with: CRNA  Anesthesia Plan Comments: (She describes some wheezing and albuteral neb  requested.JA)       Anesthesia Quick Evaluation

## 2019-07-15 NOTE — Anesthesia Postprocedure Evaluation (Signed)
Anesthesia Post Note  Patient: Laura Kim  Procedure(s) Performed: ESOPHAGOGASTRODUODENOSCOPY (EGD) WITH PROPOFOL (N/A )  Patient location during evaluation: PACU Anesthesia Type: General Level of consciousness: awake and alert and oriented Pain management: pain level controlled Vital Signs Assessment: post-procedure vital signs reviewed and stable Respiratory status: spontaneous breathing Cardiovascular status: blood pressure returned to baseline Anesthetic complications: no     Last Vitals:  Vitals:   07/15/19 1344 07/15/19 1435  BP:  (!) 177/71  Pulse:  83  Resp:  16  Temp:  36.7 C  SpO2: (!) 87% 93%    Last Pain:  Vitals:   07/15/19 1339  TempSrc:   PainSc: 0-No pain                 Raymund Manrique

## 2019-07-15 NOTE — Transfer of Care (Signed)
Immediate Anesthesia Transfer of Care Note  Patient: Laura Kim  Procedure(s) Performed: ESOPHAGOGASTRODUODENOSCOPY (EGD) WITH PROPOFOL (N/A )  Patient Location: PACU  Anesthesia Type:General  Level of Consciousness: sedated  Airway & Oxygen Therapy: Patient Spontanous Breathing and Patient connected to face mask oxygen  Post-op Assessment: Report given to RN and Post -op Vital signs reviewed and stable  Post vital signs: Reviewed and stable  Last Vitals:  Vitals Value Taken Time  BP    Temp    Pulse    Resp    SpO2      Last Pain:  Vitals:   07/15/19 1203  TempSrc: Oral  PainSc: 0-No pain         Complications: No apparent anesthesia complications

## 2019-07-15 NOTE — Progress Notes (Signed)
[facility] NAME: Laura Kim    MR#:  [id number]  DATE OF BIRTH:  1930/05/13  SUBJECTIVE:  CHIEF COMPLAINT:   Chief Complaint  Patient presents with  . Weakness  Patient seen and evaluated today S/P PRBC and FFP transfusion Has some shortness of breath No complaints of chest pain Due for endoscopy today  REVIEW OF SYSTEMS:    ROS  CONSTITUTIONAL: No documented fever. No fatigue, weakness. No weight gain, no weight loss.  EYES: No blurry or double vision.  ENT: No tinnitus. No postnasal drip. No redness of the oropharynx.  RESPIRATORY: No cough, no wheeze, no hemoptysis. Has dyspnea.  CARDIOVASCULAR: No chest pain. No orthopnea. No palpitations. No syncope.  GASTROINTESTINAL: No nausea, no vomiting or diarrhea. No abdominal pain. No melena or hematochezia.  GENITOURINARY: No dysuria or hematuria.  ENDOCRINE: No polyuria or nocturia. No heat or cold intolerance.  HEMATOLOGY: No anemia. No bruising. No bleeding.  INTEGUMENTARY: No rashes. No lesions.  MUSCULOSKELETAL: No arthritis. No swelling. No gout.  NEUROLOGIC: No numbness, tingling, or ataxia. No seizure-type activity.  PSYCHIATRIC: No anxiety. No insomnia. No ADD.   DRUG ALLERGIES:   Allergies  Allergen Reactions  . Penicillins Itching, Rash and Other (See Comments)    Did it involve swelling of the face/tongue/throat, SOB, or low BP? NO Did it involve sudden or severe rash/hives, skin peeling, or any reaction on the inside of your mouth or nose? Yes Did you need to seek medical attention at a hospital or doctor's office? Unknown When did it last happen?>35 years If all above answers are "NO", may proceed with cephalosporin use.    VITALS:  Blood pressure (!) 159/57, pulse 79, temperature 98 F (36.7 C), temperature source Oral, resp. rate 20, height 5' (1.524 m), weight 74 kg, SpO2 94 %.  PHYSICAL EXAMINATION:   Physical Exam  GENERAL:  85  y.o.-year-old patient lying in the bed with no acute distress.  EYES: Pupils equal, round, reactive to light and accommodation. No scleral icterus. Extraocular muscles intact.  HEENT: Head atraumatic, normocephalic. Oropharynx and nasopharynx clear.  NECK:  Supple, no jugular venous distention. No thyroid enlargement, no tenderness.  LUNGS: Decreased breath sounds bilaterally,basal rales heard. No use of accessory muscles of respiration.  CARDIOVASCULAR: S1, S2 normal. No murmurs, rubs, or gallops.  ABDOMEN: Soft, nontender, nondistended. Bowel sounds present. No organomegaly or mass.  EXTREMITIES: No cyanosis, clubbing or edema b/l.    NEUROLOGIC: Cranial nerves II through XII are intact. No focal Motor or sensory deficits b/l.   PSYCHIATRIC: The patient is alert and oriented x 3.  SKIN: No obvious rash, lesion, or ulcer.   LABORATORY PANEL:   CBC Recent Labs  Lab 07/14/19 0947  07/14/19 2310  WBC 8.7  --   --   HGB 8.0*   < > 7.8*  HCT 24.7*   < > 24.5*  PLT 245  --   --    < > = values in this interval not displayed.   ------------------------------------------------------------------------------------------------------------------ Chemistries  Recent Labs  Lab 07/14/19 0947  NA 139  K 4.1  CL 106  CO2 20*  GLUCOSE 166*  BUN 85*  CREATININE 4.75*  CALCIUM 10.2   ------------------------------------------------------------------------------------------------------------------  Cardiac Enzymes No results for input(s): TROPONINI in the last 168 hours. ------------------------------------------------------------------------------------------------------------------  RADIOLOGY:  US Renal  Result Date: 07/15/2019 CLINICAL DATA:  Acute renal failure EXAM: RENAL / URINARY TRACT ULTRASOUND COMPLETE COMPARISON:  MRI from  08/25/2013. FINDINGS: Right Kidney: Renal measurements: 9.6 x 4.1 x 4.4 cm. = volume: 91 mL. Multiple cysts are noted throughout the right kidney. The  largest of these measures 1.9 cm. Left Kidney: Renal measurements: 8.5 x 2.7 x 3.1 cm. = volume: 37 mL. Multiple cysts are noted within the left kidney. The largest of these measures 2.4 cm. Bladder: Appears normal for degree of bladder distention. Note is made of bilateral pleural effusions. Stable right adrenal adenoma is noted. IMPRESSION: Bilateral renal atrophy stable from the prior exams. Bilateral renal cysts as described. Bilateral pleural effusions. Stable right adrenal adenoma. Electronically Signed   By: Inez Catalina M.D.   On: 07/15/2019 12:54   Dg Chest Port 1 View  Result Date: 07/15/2019 CLINICAL DATA:  Acute respiratory failure EXAM: PORTABLE CHEST 1 VIEW COMPARISON:  07/13/2019 FINDINGS: Cardiac shadow is enlarged. Postsurgical changes are again seen and stable. Aortic calcifications are again noted. Lungs are well aerated bilaterally. Increased patchy infiltrate is noted in the right lung base medially. Chronic scarring in the left base is noted. IMPRESSION: New right basilar infiltrate Electronically Signed   By: Inez Catalina M.D.   On: 07/15/2019 08:38   Dg Chest Portable 1 View  Result Date: 07/13/2019 CLINICAL DATA:  Shortness of breath EXAM: PORTABLE CHEST 1 VIEW COMPARISON:  Areae nineteenth 2014 FINDINGS: There is cardiomegaly with overlying median sternotomy wires. There is pulmonary vascular congestion. There is a trace left pleural effusion. Aortic valve calcifications and mitral valve calcifications are noted. No acute osseous abnormality. IMPRESSION: Pulmonary vascular congestion and probable trace left pleural effusion. Electronically Signed   By: Prudencio Pair M.D.   On: 07/13/2019 22:44     ASSESSMENT AND PLAN:   85 year old elderly female patient with history of coronary disease, chronic atrial fibrillation on Coumadin, and type 2 diabetes mellitus currently under hospitalist service  -Coumadin coagulopathy Improved Patient received FFP and vitamin K Follow-up  INR  -Anemia secondary to blood loss PRBC transfusions tolerated well Hemoglobin greater than 8 Continue IV PPI Follow-up GI consult Status post endoscopy  -Acute on chronic kidney disease stage IV Renal function improving slowly Dialysis deferred Nephrology consultation and follow-up appreciated Renal ultrasound reviewed Shows bilateral renal atrophy   -Atrial fibrillation chronic Coumadin on hold secondary to coagulopathy Rate well controlled We will resume Coumadin from tomorrow  -Dyspnea probably secondary to volume overload IV Lasix given  All the records are reviewed and case discussed with Care Management/Social Worker. Management plans discussed with the patient, family and they are in agreement.  CODE STATUS: Full code  DVT Prophylaxis: SCDs  TOTAL TIME TAKING CARE OF THIS PATIENT: 35 minutes.   POSSIBLE D/C IN 2 to 3 DAYS, DEPENDING ON CLINICAL CONDITION.  Saundra Shelling M.D on 07/15/2019 at 1:27 PM  Between 7am to 6pm - Pager - [phone]  After 6pm go to [email or web address] - password [email or web address] [facility]  Office  [phone]  CC: Primary care physician; Kirk Ruths, MD  Note: This dictation was prepared with Dragon dictation along with smaller phrase technology. Any transcriptional errors that result from this process are unintentional.

## 2019-07-15 NOTE — Progress Notes (Addendum)
Name: Laura Kim MRN: [id number] DOB: 01-Dec-1929    ADMISSION DATE:  07/13/2019   BRIEF PATIENT DESCRIPTION:  85 yo female admitted to the medsurg unit on 09/9 with acute respiratory failure secondary to vascular congestion in setting of acute on chronic renal failure and acute GI bleed pt on coumadin due to paroxysmal atrial fibrillation.  Required transfer to the stepdown unit 09/10 due to worsening respiratory failure and acute on chronic renal failure.  CXR on 09/11 revealed new RLL infiltrate   SIGNIFICANT EVENTS/STUDIES:  09/9-Pt admitted to the [facility] unit with acute GI bleed  09/10-Pt developed worsening acute on chronic renal failure and acute respiratory failure requiring transfer to the stepdown unit.  Nephrology consulted recommended iv lasix  09/11-CXR revealed new RLL infiltrate started azithromycin and ceftriaxone    PAST MEDICAL HISTORY :   has a past medical history of Coronary artery disease and Diabetes mellitus without complication ([place]).  has a past surgical history that includes Cardiac surgery and Abdominal hysterectomy.  SUBJECTIVE:  Pt frustrated stating she does not like feeling tethered due to multiple monitoring devices and states after the upper endoscopy she would like to be discharged.    VITAL SIGNS: Temp:  [98 F (36.7 C)-98.5 F (36.9 C)] 98.4 F (36.9 C) (09/11 0400) Pulse Rate:  [64-74] 74 (09/11 0400) Resp:  [16-52] 52 (09/11 0400) BP: (143-184)/(35-74) 154/55 (09/11 0400) SpO2:  [93 %-100 %] 93 % (09/11 0913) Weight:  [74 kg] 74 kg (09/11 0403)  PHYSICAL EXAMINATION: General: well developed, well nourished female,  Neuro: alert and oriented, follows commands  HEENT: supple, no JVD Cardiovascular: irregular irregular, no R/G  Lungs: faint crackles throughout, even, non labored  Abdomen: +BS x4, soft, non tender, non distended  Musculoskeletal: trace bilateral lower extremity edema  Skin: left orbital ecchymotic area   Recent Labs   Lab 07/13/19 1945 07/14/19 0947  NA 138 139  K 4.3 4.1  CL 104 106  CO2 22 20*  BUN 83* 85*  CREATININE 4.96* 4.75*  GLUCOSE 218* 166*   Recent Labs  Lab 07/13/19 1945 07/14/19 0947 07/14/19 1751 07/14/19 2310  HGB 5.7* 8.0* 7.9* 7.8*  HCT 18.5* 24.7* 24.7* 24.5*  WBC 5.9 8.7  --   --   PLT 257 245  --   --    Dg Chest Port 1 View  Result Date: 07/15/2019 CLINICAL DATA:  Acute respiratory failure EXAM: PORTABLE CHEST 1 VIEW COMPARISON:  07/13/2019 FINDINGS: Cardiac shadow is enlarged. Postsurgical changes are again seen and stable. Aortic calcifications are again noted. Lungs are well aerated bilaterally. Increased patchy infiltrate is noted in the right lung base medially. Chronic scarring in the left base is noted. IMPRESSION: New right basilar infiltrate Electronically Signed   By: Inez Catalina M.D.   On: 07/15/2019 08:38   Dg Chest Portable 1 View  Result Date: 07/13/2019 CLINICAL DATA:  Shortness of breath EXAM: PORTABLE CHEST 1 VIEW COMPARISON:  Areae nineteenth 2014 FINDINGS: There is cardiomegaly with overlying median sternotomy wires. There is pulmonary vascular congestion. There is a trace left pleural effusion. Aortic valve calcifications and mitral valve calcifications are noted. No acute osseous abnormality. IMPRESSION: Pulmonary vascular congestion and probable trace left pleural effusion. Electronically Signed   By: Prudencio Pair M.D.   On: 07/13/2019 22:44    ASSESSMENT / PLAN:  Acute respiratory failure secondary to vascular congestion in setting of worsening acute on chronic renal failure  CXR 07/15/2019 new RLL infiltrate  Prn  supplemental O2 for dyspnea and/or hypoxia  Nephrology consulted appreciate input Continue IV lasix  Trend BMP  Replace electrolytes as indicated  Monitor UOP Avoid nephrotoxic medications  Trend WBC and monitor fever curve  Will start azithromycin and ceftriaxone   Hypertension  Hx: Paroxysmal atrial fibrillation, CAD, CABG, and  HLD Continuous telemetry monitoring  Troponin pending  Hold outpatient amlodipine, aspirin, benazepril, simvastatin, and torsemide  Anemia secondary to acute GI bleed (on admission PT 78.6 and INR 10.1 pt on coumadin due to paroxysmal atrial fibrillation hx)  VTE px: SCD's, hold outpatient coumadin   Serial H&H Trend coags  Transfuse for hgb <7 and/or signs of active bleeding  Continue protonix gtt  Gastroenterology consulted appreciate input-Upper endoscopy scheduled for 07/15/2019 Keep NPO for now   Type II diabetes mellitus  CBG's q4hrs  SSI   -Pt stable for transfer to the medsurg unit with off unit telemetry.  Once bed becomes available PCCM will sign off. If you need additional assistance please contact PCCM team via pager # listed in [place], [place] Pager [phone] (please enter 7 digits) PCCM Consult Pager [phone] (please enter 7 digits)

## 2019-07-15 NOTE — Anesthesia Post-op Follow-up Note (Signed)
Anesthesia QCDR form completed.        

## 2019-07-15 NOTE — Progress Notes (Signed)
Reported called to [facility].  Patient transferred at this time.

## 2019-07-15 NOTE — Op Note (Signed)
[facility] Gastroenterology Patient Name: Laura Kim Procedure Date: 07/15/2019 12:35 PM MRN: [id number] Account #: [id number] Date of Birth: October 23, 1930 Admit Type: Inpatient Age: 85 Room: [facility] ENDO ROOM 4 Gender: Female Note Status: Finalized Procedure:            Upper GI endoscopy Indications:          Melena Providers:            [name] MD, MD Referring MD:         Ocie Cornfield. Ouida Sills MD, MD (Referring MD) Medicines:            Propofol per Anesthesia Complications:        No immediate complications. Procedure:            Pre-Anesthesia Assessment:                       - Prior to the procedure, a History and Physical was                        performed, and patient medications and allergies were                        reviewed. The patient's tolerance of previous                        anesthesia was also reviewed. The risks and benefits of                        the procedure and the sedation options and risks were                        discussed with the patient. All questions were                        answered, and informed consent was obtained. Prior                        Anticoagulants: The patient has taken no previous                        anticoagulant or antiplatelet agents. ASA Grade                        Assessment: II - A patient with mild systemic disease.                        After reviewing the risks and benefits, the patient was                        deemed in satisfactory condition to undergo the                        procedure.                       After obtaining informed consent, the endoscope was                        passed under direct vision. Throughout the procedure,  the patient's blood pressure, pulse, and oxygen                        saturations were monitored continuously. The Endoscope                        was introduced through the mouth, and advanced to the      second part of duodenum. The upper GI endoscopy was                        accomplished without difficulty. The patient tolerated                        the procedure well. Findings:      A medium-sized hiatal hernia was present.      The entire examined stomach was normal. Coagulation for hemostasis using       argon beam at 2 liters/minute and 25 watts was successful.      A single 4 mm angiodysplastic lesion without bleeding was found in the       second portion of the duodenum. Impression:           - Medium-sized hiatal hernia.                       - Normal stomach. Treated with argon beam coagulation.                       - A single non-bleeding angiodysplastic lesion in the                        duodenum.                       - No specimens collected. Recommendation:       - Return patient to hospital ward for ongoing care.                       - Resume regular diet.                       - Continue present medications. Procedure Code(s):    --- Professional ---                       [phone], Esophagogastroduodenoscopy, flexible, transoral;                        with control of bleeding, any method Diagnosis Code(s):    --- Professional ---                       K92.1, Melena (includes Hematochezia)                       K31.819, Angiodysplasia of stomach and duodenum without                        bleeding CPT copyright 2019 American Medical Association. All rights reserved. The codes documented in this report are preliminary and upon coder review may  be revised to meet current compliance requirements. [name] MD, MD 07/15/2019 1:08:45 PM This report has been signed electronically. Number of Addenda: 0 Note Initiated  On: 07/15/2019 12:35 PM Estimated Blood Loss: Estimated blood loss: none.      [facility]

## 2019-07-16 LAB — RENAL FUNCTION PANEL
Albumin: 3.4 g/dL — ABNORMAL LOW (ref 3.5–5.0)
Anion gap: 10 (ref 5–15)
BUN: 89 mg/dL — ABNORMAL HIGH (ref 8–23)
CO2: 23 mmol/L (ref 22–32)
Calcium: 9.3 mg/dL (ref 8.9–10.3)
Chloride: 106 mmol/L (ref 98–111)
Creatinine, Ser: 5.17 mg/dL — ABNORMAL HIGH (ref 0.44–1.00)
GFR calc Af Amer: 8 mL/min — ABNORMAL LOW (ref >60)
GFR calc non Af Amer: 7 mL/min — ABNORMAL LOW (ref >60)
Glucose, Bld: 131 mg/dL — ABNORMAL HIGH (ref 70–99)
Phosphorus: 3.5 mg/dL (ref 2.5–4.6)
Potassium: 3.4 mmol/L — ABNORMAL LOW (ref 3.5–5.1)
Sodium: 139 mmol/L (ref 135–145)

## 2019-07-16 LAB — HEMOGLOBIN A1C
Hgb A1c MFr Bld: 5.3 % (ref 4.8–5.6)
Mean Plasma Glucose: 105 mg/dL

## 2019-07-16 LAB — CBC
HCT: 25.1 % — ABNORMAL LOW (ref 36.0–46.0)
Hemoglobin: 7.9 g/dL — ABNORMAL LOW (ref 12.0–15.0)
MCH: 27 pg (ref 26.0–34.0)
MCHC: 31.5 g/dL (ref 30.0–36.0)
MCV: 85.7 fL (ref 80.0–100.0)
Platelets: 255 10*3/uL (ref 150–400)
RBC: 2.93 MIL/uL — ABNORMAL LOW (ref 3.87–5.11)
RDW: 18 % — ABNORMAL HIGH (ref 11.5–15.5)
WBC: 8.5 10*3/uL (ref 4.0–10.5)
nRBC: 0 % (ref 0.0–0.2)

## 2019-07-16 LAB — GLUCOSE, CAPILLARY
Glucose-Capillary: 171 mg/dL — ABNORMAL HIGH (ref 70–99)
Glucose-Capillary: 143 mg/dL — ABNORMAL HIGH (ref 70–99)
Glucose-Capillary: 153 mg/dL — ABNORMAL HIGH (ref 70–99)
Glucose-Capillary: 129 mg/dL — ABNORMAL HIGH (ref 70–99)
Glucose-Capillary: 242 mg/dL — ABNORMAL HIGH (ref 70–99)

## 2019-07-16 MED ORDER — WARFARIN SODIUM 2 MG PO TABS
2.0000 mg | ORAL_TABLET | Freq: Once | ORAL | Status: AC
  Administered 2019-07-16: 2 mg via ORAL
  Filled 2019-07-16: qty 1

## 2019-07-16 MED ORDER — FUROSEMIDE 40 MG PO TABS
40.0000 mg | ORAL_TABLET | Freq: Every day | ORAL | Status: DC
  Administered 2019-07-17 – 2019-07-20 (×4): 40 mg via ORAL
  Filled 2019-07-16 (×4): qty 1

## 2019-07-16 MED ORDER — WARFARIN - PHARMACIST DOSING INPATIENT: Freq: Every day | Status: DC

## 2019-07-16 NOTE — Evaluation (Signed)
Physical Therapy Evaluation Patient Details Name: Laura Kim MRN: [id number] DOB: June 25, 1930 Today's Date: 07/16/2019   History of Present Illness  85 yo female with symptoms of GI bleed was admitted with INR 10.1, acute respiratory failure and weakness for 1-2 weeks prior to her arrival.  Had Hgb 5.7 at ED, was having pleural effusion and infiltrate, but noted stable R adrenal adenoma.  ARF, AKI.  PMHx:  a-fib, on coumadin, CAD, DM, AKI   Clinical Impression  Pt was seen with her daughter in attendance to assist with information and to encourage pt.  Her plan is to work toward most competent level of balance with strengthening to walk and increase endurance as pt tolerates, with monitoring of her vitals and LOB recovery.  Pt will be able to have return home but will require dependable around the clock care.  Follow up with all acute therapy as tolerated, and refer to HHPT and may then go on to outpatient therapy for residual symptoms and to monitor given her recent need to be transfused.    Follow Up Recommendations Home health PT;Supervision for mobility/OOB;Supervision/Assistance - 24 hour    Equipment Recommendations  None recommended by PT    Recommendations for Other Services       Precautions / Restrictions Precautions Precautions: Fall Precaution Comments: impulsive Restrictions Weight Bearing Restrictions: No      Mobility  Bed Mobility Overal bed mobility: Needs Assistance Bed Mobility: Supine to Sit;Sit to Supine     Supine to sit: Min assist Sit to supine: Min assist      Transfers Overall transfer level: Needs assistance Equipment used: Rolling walker (2 wheeled);1 person hand held assist Transfers: Sit to/from Stand Sit to Stand: Min guard;Min assist         General transfer comment: min guard higher surfaces and min assist lower surfaces  Ambulation/Gait Ambulation/Gait assistance: Min guard Gait Distance (Feet): 120 Feet Assistive device:  Rolling walker (2 wheeled);1 person hand held assist Gait Pattern/deviations: Step-through pattern;Narrow base of support;Trunk flexed;Decreased stride length Gait velocity: reduced Gait velocity interpretation: <1.31 ft/sec, indicative of household ambulator General Gait Details: pulse fluctuates from 72 to 104 with gait  Stairs            Wheelchair Mobility    Modified Rankin (Stroke Patients Only)       Balance Overall balance assessment: Needs assistance Sitting-balance support: Feet supported Sitting balance-Leahy Scale: Good     Standing balance support: Bilateral upper extremity supported Standing balance-Leahy Scale: Fair                               Pertinent Vitals/Pain Pain Assessment: No/denies pain    Home Living Family/patient expects to be discharged to:: Private residence Living Arrangements: Spouse/significant other Available Help at Discharge: Family;Available 24 hours/day Type of Home: House Home Access: Level entry     Home Layout: One level Home Equipment: Cane - single point;Walker - 2 wheels;Shower seat Additional Comments: daughter in to sit with mother and other daughter with pt's husband    Prior Function Level of Independence: Independent         Comments: was not using an AD at home     Hand Dominance   Dominant Hand: Right    Extremity/Trunk Assessment   Upper Extremity Assessment Upper Extremity Assessment: Overall WFL for tasks assessed    Lower Extremity Assessment Lower Extremity Assessment: Generalized weakness    Cervical /  Trunk Assessment Cervical / Trunk Assessment: Kyphotic  Communication   Communication: No difficulties  Cognition Arousal/Alertness: Awake/alert Behavior During Therapy: WFL for tasks assessed/performed Overall Cognitive Status: Within Functional Limits for tasks assessed                                        General Comments General comments (skin  integrity, edema, etc.): Pt was able to use RW but found it to be heavy and pulses were fluctuating    Exercises     Assessment/Plan    PT Assessment Patient needs continued PT services  PT Problem List Decreased range of motion;Decreased activity tolerance;Decreased balance;Decreased mobility;Decreased coordination;Decreased cognition;Decreased knowledge of use of DME;Decreased safety awareness;Cardiopulmonary status limiting activity       PT Treatment Interventions DME instruction;Gait training;Stair training;Functional mobility training;Therapeutic activities;Therapeutic exercise;Balance training;Neuromuscular re-education;Patient/family education    PT Goals (Current goals can be found in the Care Plan section)  Acute Rehab PT Goals Patient Stated Goal: to walk and get home PT Goal Formulation: With patient/family Time For Goal Achievement: 07/30/19 Potential to Achieve Goals: Good    Frequency Min 2X/week   Barriers to discharge Inaccessible home environment;Decreased caregiver support husband is having mobility issues    Co-evaluation               AM-PAC PT "6 Clicks" Mobility  Outcome Measure Help needed turning from your back to your side while in a flat bed without using bedrails?: A Little Help needed moving from lying on your back to sitting on the side of a flat bed without using bedrails?: A Little Help needed moving to and from a bed to a chair (including a wheelchair)?: A Little   Help needed to walk in hospital room?: A Little Help needed climbing 3-5 steps with a railing? : Total 6 Click Score: 13    End of Session Equipment Utilized During Treatment: Gait belt Activity Tolerance: Patient limited by fatigue;Treatment limited secondary to medical complications (Comment) Patient left: in bed;with bed alarm set;with call bell/phone within reach;with family/visitor present Nurse Communication: Mobility status PT Visit Diagnosis: Unsteadiness on feet  (R26.81);Difficulty in walking, not elsewhere classified (R26.2)    Time: [id number] PT Time Calculation (min) (ACUTE ONLY): 22 min   Charges:   PT Evaluation $PT Eval Low Complexity: 1 Low $PT Eval Moderate Complexity: 1 Mod         Ramond Dial 07/16/2019, 8:24 PM   Mee Hives, PT MS Acute Rehab Dept. Number: [facility] [place] [facility] [place]

## 2019-07-16 NOTE — Progress Notes (Addendum)
Spoke with patient's daughter Anderson Malta over the phone. Spoke with gastroenterologist, okay to restart Coumadin at low-dose, patient is high risk for bleeding, patient to have Coumadin at a very low dose and see how she does pill in the hospital, spoke with patient daughter about this, she is okay with this.  Pharmacy consult for anticoagulation, start Coumadin at low-dose

## 2019-07-16 NOTE — Progress Notes (Addendum)
[place] for warfarin dosing  Indication: atrial fibrillation; patient with coagulopathy this admission requiring FFP and Vitamin K.   Allergies  Allergen Reactions  . Penicillins Itching, Rash and Other (See Comments)    Did it involve swelling of the face/tongue/throat, SOB, or low BP? NO Did it involve sudden or severe rash/hives, skin peeling, or any reaction on the inside of your mouth or nose? Yes Did you need to seek medical attention at a hospital or doctor's office? Unknown When did it last happen?>35 years If all above answers are "NO", may proceed with cephalosporin use.    Patient Measurements: Height: 5' (152.4 cm) Weight: 163 lb 2.3 oz (74 kg) IBW/kg (Calculated) : 45.5  Vital Signs: Temp: 97.6 F (36.4 C) (09/12 1338) Temp Source: Oral (09/12 1338) BP: 149/51 (09/12 1338) Pulse Rate: 68 (09/12 1338)  Labs: Recent Labs    07/13/19 1945 07/14/19 0221 07/14/19 0947  07/14/19 2310 07/15/19 1444 07/16/19 0413 07/16/19 0929  HGB 5.7*  --  8.0*   < > 7.8* 8.2*  --  7.9*  HCT 18.5*  --  24.7*   < > 24.5* 26.4*  --  25.1*  PLT 257  --  245  --   --  289  --  255  LABPROT 78.6*  --  22.1*  --   --  16.2*  --   --   INR 10.1*  --  2.0*  --   --  1.3*  --   --   CREATININE 4.96*  --  4.75*  --   --   --  5.17*  --   TROPONINIHS  --  54*  --   --   --   --   --   --    < > = values in this interval not displayed.    Estimated Creatinine Clearance: 7 mL/min (A) (by C-G formula based on SCr of 5.17 mg/dL (H)).   Medical History: Past Medical History:  Diagnosis Date  . Coronary artery disease   . Diabetes mellitus without complication ([place])   . Seizures ([facility])     Medications:  Scheduled:  . azithromycin  500 mg Oral Daily  . cefdinir  300 mg Oral QODAY  . Chlorhexidine Gluconate Cloth  6 each Topical Daily  . docusate sodium  100 mg Oral BID  . [START ON 07/17/2019] furosemide  40 mg Oral Daily  . influenza vaccine  adjuvanted  0.5 mL Intramuscular Tomorrow-1000  . insulin aspart  0-9 Units Subcutaneous TID AC & HS  . ipratropium-albuterol  3 mL Nebulization Q6H WA  . pantoprazole  40 mg Oral BID  . warfarin  2 mg Oral ONCE-1800  . Warfarin - Pharmacist Dosing Inpatient   Does not apply q1800   Infusions:  . sodium chloride Stopped (07/15/19 1653)    Assessment: Pharmacy consulted to restart warfarin for 85 yo female admitted with coagulopathy secondary to warfarin requiring FFP and vitamin K. Patient takes warfarin 5mg  daily as an outpatient. Patient has history significant for atrial fibrillation and stage IV chronic kidney disease. Patient receiving azithromycin and cefdinir PO.   Goal of Therapy:  INR 2-3 Monitor platelets by anticoagulation protocol: Yes   Plan:  Will initiate warfarin 2mg  (40% of home dose) PO x 1. Will obtain INR and CBC with am labs.   Pharmacy will continue to monitor and adjust per consult.   Simpson,Michael L 07/16/2019,2:44 PM

## 2019-07-16 NOTE — Progress Notes (Signed)
[facility]  ROUNDING NOTE   Subjective:   EGD yesterday. Angiodysplastic lesion in duodenum.   Hemoglobin 7.9 (8.2) reports no more melanotic stools  Creatinine 5.17 (4.75)  Objective:  Vital signs in last 24 hours:  Temp:  [98 F (36.7 C)-99 F (37.2 C)] 98.2 F (36.8 C) (09/12 0351) Pulse Rate:  [70-90] 84 (09/12 0351) Resp:  [16-26] 20 (09/12 0351) BP: (117-177)/(42-71) 159/66 (09/12 0351) SpO2:  [87 %-97 %] 92 % (09/12 0351) Weight:  [74 kg] 74 kg (09/12 0500)  Weight change: 0 kg Filed Weights   07/14/19 0135 07/15/19 0403 07/16/19 0500  Weight: 75 kg 74 kg 74 kg    Intake/Output: I/O last 3 completed shifts: In: 582.6 [I.V.:582.6] Out: 1200 [Urine:1200]   Intake/Output this shift:  Total I/O In: -  Out: 200 [Urine:200]  Physical Exam: General: NAD, sitting in chair  Head: Normocephalic, atraumatic. Moist oral mucosal membranes  Eyes: Anicteric, PERRL  Neck: Supple, trachea midline  Lungs:  clear   Heart: Irregular, +murmur  Abdomen:  Soft, nontender  Extremities: trace peripheral edema.  Neurologic: Nonfocal, moving all four extremities  Skin: No lesions        Basic Metabolic Panel: Recent Labs  Lab 07/13/19 1945 07/14/19 0947 07/15/19 1444 07/16/19 0413  NA 138 139  --  139  K 4.3 4.1  --  3.4*  CL 104 106  --  106  CO2 22 20*  --  23  GLUCOSE 218* 166*  --  131*  BUN 83* 85*  --  89*  CREATININE 4.96* 4.75*  --  5.17*  CALCIUM 10.2 10.2  --  9.3  MG  --   --  2.3  --   PHOS  --   --  3.9 3.5    Liver Function Tests: Recent Labs  Lab 07/16/19 0413  ALBUMIN 3.4*   No results for input(s): LIPASE, AMYLASE in the last 168 hours. No results for input(s): AMMONIA in the last 168 hours.  CBC: Recent Labs  Lab 07/13/19 1945 07/14/19 0947 07/14/19 1751 07/14/19 2310 07/15/19 1444 07/16/19 0929  WBC 5.9 8.7  --   --  9.1 8.5  NEUTROABS  --   --   --   --  7.2  --   HGB 5.7* 8.0* 7.9* 7.8* 8.2* 7.9*  HCT 18.5*  24.7* 24.7* 24.5* 26.4* 25.1*  MCV 89.8 83.7  --   --  85.7 85.7  PLT 257 245  --   --  289 255    Cardiac Enzymes: No results for input(s): CKTOTAL, CKMB, CKMBINDEX, TROPONINI in the last 168 hours.  BNP: Invalid input(s): POCBNP  CBG: Recent Labs  Lab 07/15/19 1209 07/15/19 1646 07/15/19 2119 07/16/19 0816 07/16/19 1137  GLUCAP 196* 213* 171* 143* 153*    Microbiology: Results for orders placed or performed during the hospital encounter of 07/13/19  SARS CORONAVIRUS 2 (TAT 6-24 HRS) Nasopharyngeal Nasopharyngeal Swab     Status: None   Collection Time: 07/13/19 10:42 PM   Specimen: Nasopharyngeal Swab  Result Value Ref Range Status   SARS Coronavirus 2 NEGATIVE NEGATIVE Final    Comment: (NOTE) SARS-CoV-2 target nucleic acids are NOT DETECTED. The SARS-CoV-2 RNA is generally detectable in upper and lower respiratory specimens during the acute phase of infection. Negative results do not preclude SARS-CoV-2 infection, do not rule out co-infections with other pathogens, and should not be used as the sole basis for treatment or other patient management decisions. Negative results  must be combined with clinical observations, patient history, and epidemiological information. The expected result is Negative. Fact Sheet for Patients: SugarRoll.be Fact Sheet for Healthcare Providers: https://www.woods-mathews.com/ This test is not yet approved or cleared by the [place] FDA and  has been authorized for detection and/or diagnosis of SARS-CoV-2 by FDA under an Emergency Use Authorization (EUA). This EUA will remain  in effect (meaning this test can be used) for the duration of the COVID-19 declaration under Section 56 4(b)(1) of the Act, 21 U.S.C. section 360bbb-3(b)(1), unless the authorization is terminated or revoked sooner. Performed at [facility], [place] [address] [place], [place] [address]   MRSA PCR Screening      Status: None   Collection Time: 07/14/19  2:28 PM  Result Value Ref Range Status   MRSA by PCR NEGATIVE NEGATIVE Final    Comment:        The GeneXpert MRSA Assay (FDA approved for NASAL specimens only), is one component of a comprehensive MRSA colonization surveillance program. It is not intended to diagnose MRSA infection nor to guide or monitor treatment for MRSA infections. Performed at [facility], [address] [place], [place] [address]     Coagulation Studies: Recent Labs    07/13/19 1945 07/14/19 0947 07/15/19 1444  LABPROT 78.6* 22.1* 16.2*  INR 10.1* 2.0* 1.3*    Urinalysis: Recent Labs    07/13/19 2219  COLORURINE AMBER*  LABSPEC 1.011  PHURINE 5.0  GLUCOSEU 50*  HGBUR MODERATE*  BILIRUBINUR NEGATIVE  KETONESUR NEGATIVE  PROTEINUR 100*  NITRITE NEGATIVE  LEUKOCYTESUR LARGE*      Imaging: US Renal  Result Date: 07/15/2019 CLINICAL DATA:  Acute renal failure EXAM: RENAL / URINARY TRACT ULTRASOUND COMPLETE COMPARISON:  MRI from 08/25/2013. FINDINGS: Right Kidney: Renal measurements: 9.6 x 4.1 x 4.4 cm. = volume: 91 mL. Multiple cysts are noted throughout the right kidney. The largest of these measures 1.9 cm. Left Kidney: Renal measurements: 8.5 x 2.7 x 3.1 cm. = volume: 37 mL. Multiple cysts are noted within the left kidney. The largest of these measures 2.4 cm. Bladder: Appears normal for degree of bladder distention. Note is made of bilateral pleural effusions. Stable right adrenal adenoma is noted. IMPRESSION: Bilateral renal atrophy stable from the prior exams. Bilateral renal cysts as described. Bilateral pleural effusions. Stable right adrenal adenoma. Electronically Signed   By: Inez Catalina M.D.   On: 07/15/2019 12:54   Dg Chest Port 1 View  Result Date: 07/15/2019 CLINICAL DATA:  Acute respiratory failure EXAM: PORTABLE CHEST 1 VIEW COMPARISON:  07/13/2019 FINDINGS: Cardiac shadow is enlarged. Postsurgical changes are again seen and  stable. Aortic calcifications are again noted. Lungs are well aerated bilaterally. Increased patchy infiltrate is noted in the right lung base medially. Chronic scarring in the left base is noted. IMPRESSION: New right basilar infiltrate Electronically Signed   By: Inez Catalina M.D.   On: 07/15/2019 08:38     Medications:   . sodium chloride Stopped (07/15/19 1653)   . azithromycin  500 mg Oral Daily  . cefdinir  300 mg Oral QODAY  . Chlorhexidine Gluconate Cloth  6 each Topical Daily  . docusate sodium  100 mg Oral BID  . furosemide  40 mg Oral BID  . influenza vaccine adjuvanted  0.5 mL Intramuscular Tomorrow-1000  . insulin aspart  0-9 Units Subcutaneous TID AC & HS  . ipratropium-albuterol  3 mL Nebulization Q6H WA  . pantoprazole  40 mg Oral BID  acetaminophen **OR** acetaminophen, bisacodyl, hydrALAZINE, HYDROcodone-acetaminophen, ondansetron **OR** ondansetron (ZOFRAN) IV, traZODone  Assessment/ Plan:  [name] is a 85 y.o. white female with diabetes mellitus type II insulin dependent, coronary artery disease, hypertension, asthma , atrial fibrillation on warfarin, who was admitted to [facility] on 07/13/2019 for hypercoaguable state with GI bleed and symptomatic anemia.   Does not follow with a outpatient nephrologist.  1. Acute renal failure on chronic kidney disease stage IV. Baseline creatinine of 3.6, GFR of 12 on 06/09/19.  Chronic kidney disease secondary to diabetes mellitus type II.  Ultrasound consistent with chronic kidney disease.  Acute renal failure secondary acute blood loss.  - No acute indication for dialysis but unfortunately low threshold to start - Pending renal ultrasound - PO furosemide - change to daily.   2. Anemia with renal failure:  Status post 3 units PRBC and FFP. With hypercoaguable state status post vitamin K.  EGD by Dr. Allen Norris on 9/11. Duodenal lesion found.   3. Hypertension: elevated. With atrial fibrillation - holding benazepril  due to acute renal failure.    LOS: 3 Arabelle Bollig 9/12/202011:51 AM

## 2019-07-16 NOTE — Progress Notes (Signed)
[facility] [place] NAME: Natale Melgarejo    MR#:  [id number]  DATE OF BIRTH:  11-Nov-1929  SUBJECTIVE:  CHIEF COMPLAINT:   Chief Complaint  Patient presents with  . Weakness  Patient seen and evaluated today S/P PRBC and FFP transfusion H feeling better, hemoglobin stable around 7.9. REVIEW OF SYSTEMS:    ROS  CONSTITUTIONAL: No documented fever. No fatigue, weakness. No weight gain, no weight loss.  EYES: No blurry or double vision.  ENT: No tinnitus. No postnasal drip. No redness of the oropharynx.  RESPIRATORY: No cough, no wheeze, no hemoptysis. Has dyspnea.  CARDIOVASCULAR: No chest pain. No orthopnea. No palpitations. No syncope.  GASTROINTESTINAL: No nausea, no vomiting or diarrhea. No abdominal pain. No melena or hematochezia.  GENITOURINARY: No dysuria or hematuria.  ENDOCRINE: No polyuria or nocturia. No heat or cold intolerance.  HEMATOLOGY: No anemia. No bruising. No bleeding.  INTEGUMENTARY: No rashes. No lesions.  MUSCULOSKELETAL: No arthritis. No swelling. No gout.  NEUROLOGIC: No numbness, tingling, or ataxia. No seizure-type activity.  PSYCHIATRIC: No anxiety. No insomnia. No ADD.   DRUG ALLERGIES:   Allergies  Allergen Reactions  . Penicillins Itching, Rash and Other (See Comments)    Did it involve swelling of the face/tongue/throat, SOB, or low BP? NO Did it involve sudden or severe rash/hives, skin peeling, or any reaction on the inside of your mouth or nose? Yes Did you need to seek medical attention at a hospital or doctor's office? Unknown When did it last happen?>35 years If all above answers are "NO", may proceed with cephalosporin use.    VITALS:  Blood pressure (!) 159/66, pulse 84, temperature 98.2 F (36.8 C), temperature source Oral, resp. rate 20, height 5' (1.524 m), weight 74 kg, SpO2 92 %.  PHYSICAL EXAMINATION:   Physical Exam  GENERAL:  85 y.o.-year-old patient lying in the bed with  no acute distress.  EYES: Pupils equal, round, reactive to light and accommodation. No scleral icterus. Extraocular muscles intact.  HEENT: Head atraumatic, normocephalic. Oropharynx and nasopharynx clear.  NECK:  Supple, no jugular venous distention. No thyroid enlargement, no tenderness.  LUNGS: Decreased breath sounds bilaterally,basal rales heard. No use of accessory muscles of respiration.  CARDIOVASCULAR: S1, S2 normal. No murmurs, rubs, or gallops.  ABDOMEN: Soft, nontender, nondistended. Bowel sounds present. No organomegaly or mass.  EXTREMITIES: No cyanosis, clubbing or edema b/l.    NEUROLOGIC: Cranial nerves II through XII are intact. No focal Motor or sensory deficits b/l.   PSYCHIATRIC: The patient is alert and oriented x 3.  SKIN: No obvious rash, lesion, or ulcer.   LABORATORY PANEL:   CBC Recent Labs  Lab 07/16/19 0929  WBC 8.5  HGB 7.9*  HCT 25.1*  PLT 255   ------------------------------------------------------------------------------------------------------------------ Chemistries  Recent Labs  Lab 07/15/19 1444 07/16/19 0413  NA  --  139  K  --  3.4*  CL  --  106  CO2  --  23  GLUCOSE  --  131*  BUN  --  89*  CREATININE  --  5.17*  CALCIUM  --  9.3  MG 2.3  --    ------------------------------------------------------------------------------------------------------------------  Cardiac Enzymes No results for input(s): TROPONINI in the last 168 hours. ------------------------------------------------------------------------------------------------------------------  RADIOLOGY:  US Renal  Result Date: 07/15/2019 CLINICAL DATA:  Acute renal failure EXAM: RENAL / URINARY TRACT ULTRASOUND COMPLETE COMPARISON:  MRI from 08/25/2013. FINDINGS: Right Kidney: Renal measurements: 9.6 x 4.1 x 4.4 cm. =  volume: 91 mL. Multiple cysts are noted throughout the right kidney. The largest of these measures 1.9 cm. Left Kidney: Renal measurements: 8.5 x 2.7 x 3.1 cm. =  volume: 37 mL. Multiple cysts are noted within the left kidney. The largest of these measures 2.4 cm. Bladder: Appears normal for degree of bladder distention. Note is made of bilateral pleural effusions. Stable right adrenal adenoma is noted. IMPRESSION: Bilateral renal atrophy stable from the prior exams. Bilateral renal cysts as described. Bilateral pleural effusions. Stable right adrenal adenoma. Electronically Signed   By: Inez Catalina M.D.   On: 07/15/2019 12:54   Dg Chest Port 1 View  Result Date: 07/15/2019 CLINICAL DATA:  Acute respiratory failure EXAM: PORTABLE CHEST 1 VIEW COMPARISON:  07/13/2019 FINDINGS: Cardiac shadow is enlarged. Postsurgical changes are again seen and stable. Aortic calcifications are again noted. Lungs are well aerated bilaterally. Increased patchy infiltrate is noted in the right lung base medially. Chronic scarring in the left base is noted. IMPRESSION: New right basilar infiltrate Electronically Signed   By: Inez Catalina M.D.   On: 07/15/2019 08:38     ASSESSMENT AND PLAN:   85 year old elderly female patient with history of coronary disease, chronic atrial fibrillation on Coumadin, and type 2 diabetes mellitus currently under hospitalist service  -Coumadin coagulopathy Improved, INR 10.1 when she came,   INR 1.3.     Patient received FFP and vitamin K Follow-up INR  -Anemia secondary to blood loss PRBC transfusions tolerated well Hemoglobin stable.  Continue IV PPI Follow-up GI consult Status post endoscopy, EGD done yesterday, showed nonbleeding angiodysplasia status post argon beam coagulation.   -Acute on chronic kidney disease stage IV  Renal function improving slowly Dialysis deferred Nephrology consultation and follow-up appreciated Renal ultrasound reviewed Shows bilateral renal atrophy Has anemia with renal failure, received a 3 units of packed RBC, FFP.  -Atrial fibrillation chronic Coumadin on hold secondary to coagulopathy Rate  well controlled We will resume Coumadin if okay with gastroenterology.  -Dyspnea probably secondary to volume overload IV Lasix given Acute respiratory failure with hypoxia due to volume overload improved, patient is on room air right now.   Deconditioning, physical therapy consult today.   All the records are reviewed and case discussed with Care Management/Social Worker. Management plans discussed with the patient, family and they are in agreement.  CODE STATUS: Full code  DVT Prophylaxis: SCDs  TOTAL TIME TAKING CARE OF THIS PATIENT: 35 minutes.   POSSIBLE D/C IN 2 to 3 DAYS, DEPENDING ON CLINICAL CONDITION.  Epifanio Lesches M.D on 07/16/2019 at 10:37 AM  Between 7am to 6pm - Pager - [phone]  After 6pm go to [email or web address] - password [email or web address] [facility]  Office  [phone]  CC: Primary care physician; Kirk Ruths, MD  Note: This dictation was prepared with Dragon dictation along with smaller phrase technology. Any transcriptional errors that result from this process are unintentional.

## 2019-07-17 ENCOUNTER — Inpatient Hospital Stay: Payer: Medicare Other

## 2019-07-17 LAB — GLUCOSE, CAPILLARY
Glucose-Capillary: 140 mg/dL — ABNORMAL HIGH (ref 70–99)
Glucose-Capillary: 207 mg/dL — ABNORMAL HIGH (ref 70–99)
Glucose-Capillary: 133 mg/dL — ABNORMAL HIGH (ref 70–99)
Glucose-Capillary: 174 mg/dL — ABNORMAL HIGH (ref 70–99)

## 2019-07-17 LAB — CBC
HCT: 23.3 % — ABNORMAL LOW (ref 36.0–46.0)
Hemoglobin: 7.4 g/dL — ABNORMAL LOW (ref 12.0–15.0)
MCH: 27 pg (ref 26.0–34.0)
MCHC: 31.8 g/dL (ref 30.0–36.0)
MCV: 85 fL (ref 80.0–100.0)
Platelets: 222 10*3/uL (ref 150–400)
RBC: 2.74 MIL/uL — ABNORMAL LOW (ref 3.87–5.11)
RDW: 17.8 % — ABNORMAL HIGH (ref 11.5–15.5)
WBC: 8 10*3/uL (ref 4.0–10.5)
nRBC: 0 % (ref 0.0–0.2)

## 2019-07-17 LAB — PARATHYROID HORMONE, INTACT (NO CA): PTH: 21 pg/mL (ref 15–65)

## 2019-07-17 LAB — BASIC METABOLIC PANEL
Anion gap: 10 (ref 5–15)
BUN: 86 mg/dL — ABNORMAL HIGH (ref 8–23)
CO2: 23 mmol/L (ref 22–32)
Calcium: 8.9 mg/dL (ref 8.9–10.3)
Chloride: 107 mmol/L (ref 98–111)
Creatinine, Ser: 4.99 mg/dL — ABNORMAL HIGH (ref 0.44–1.00)
GFR calc Af Amer: 8 mL/min — ABNORMAL LOW (ref >60)
GFR calc non Af Amer: 7 mL/min — ABNORMAL LOW (ref >60)
Glucose, Bld: 149 mg/dL — ABNORMAL HIGH (ref 70–99)
Potassium: 3.1 mmol/L — ABNORMAL LOW (ref 3.5–5.1)
Sodium: 140 mmol/L (ref 135–145)

## 2019-07-17 LAB — KAPPA/LAMBDA LIGHT CHAINS
Kappa free light chain: 115.1 mg/L — ABNORMAL HIGH (ref 3.3–19.4)
Kappa, lambda light chain ratio: 1.84 — ABNORMAL HIGH (ref 0.26–1.65)
Lambda free light chains: 62.6 mg/L — ABNORMAL HIGH (ref 5.7–26.3)

## 2019-07-17 LAB — PROTIME-INR
INR: 1.3 — ABNORMAL HIGH (ref 0.8–1.2)
Prothrombin Time: 16.3 seconds — ABNORMAL HIGH (ref 11.4–15.2)

## 2019-07-17 LAB — POTASSIUM: Potassium: 3.8 mmol/L (ref 3.5–5.1)

## 2019-07-17 LAB — MAGNESIUM
Magnesium: 2.1 mg/dL (ref 1.7–2.4)
Magnesium: 2.1 mg/dL (ref 1.7–2.4)

## 2019-07-17 MED ORDER — WARFARIN SODIUM 2 MG PO TABS
2.0000 mg | ORAL_TABLET | Freq: Once | ORAL | Status: AC
  Administered 2019-07-17: 2 mg via ORAL
  Filled 2019-07-17: qty 1

## 2019-07-17 MED ORDER — POTASSIUM CHLORIDE CRYS ER 20 MEQ PO TBCR
40.0000 meq | EXTENDED_RELEASE_TABLET | Freq: Once | ORAL | Status: AC
  Administered 2019-07-17: 40 meq via ORAL
  Filled 2019-07-17: qty 2

## 2019-07-17 MED ORDER — POTASSIUM CHLORIDE CRYS ER 20 MEQ PO TBCR: 40.0000 meq | EXTENDED_RELEASE_TABLET | Freq: Once | ORAL | Status: DC

## 2019-07-17 MED ORDER — POTASSIUM CHLORIDE CRYS ER 20 MEQ PO TBCR
20.0000 meq | EXTENDED_RELEASE_TABLET | Freq: Once | ORAL | Status: AC
  Administered 2019-07-17: 20 meq via ORAL
  Filled 2019-07-17: qty 1

## 2019-07-17 MED ORDER — PHENOL 1.4 % MT LIQD
1.0000 | OROMUCOSAL | Status: DC | PRN
  Administered 2019-07-17: 1 via OROMUCOSAL
  Filled 2019-07-17: qty 177

## 2019-07-17 NOTE — Progress Notes (Addendum)
[place] for warfarin dosing  Indication: atrial fibrillation; patient with coagulopathy this admission requiring FFP and Vitamin K.   Allergies  Allergen Reactions  . Penicillins Itching, Rash and Other (See Comments)    Did it involve swelling of the face/tongue/throat, SOB, or low BP? NO Did it involve sudden or severe rash/hives, skin peeling, or any reaction on the inside of your mouth or nose? Yes Did you need to seek medical attention at a hospital or doctor's office? Unknown When did it last happen?>35 years If all above answers are "NO", may proceed with cephalosporin use.    Patient Measurements: Height: 5' (152.4 cm) Weight: 158 lb 11.7 oz (72 kg) IBW/kg (Calculated) : 45.5  Vital Signs: Temp: 97.6 F (36.4 C) (09/13 0448) Temp Source: Oral (09/13 0448) BP: 154/60 (09/13 0448) Pulse Rate: 76 (09/13 0448)  Labs: Recent Labs    07/15/19 1444 07/16/19 0413 07/16/19 0929 07/17/19 0332  HGB 8.2*  --  7.9* 7.4*  HCT 26.4*  --  25.1* 23.3*  PLT 289  --  255 222  LABPROT 16.2*  --   --  16.3*  INR 1.3*  --   --  1.3*  CREATININE  --  5.17*  --  4.99*    Estimated Creatinine Clearance: 7.2 mL/min (A) (by C-G formula based on SCr of 4.99 mg/dL (H)).   Medical History: Past Medical History:  Diagnosis Date  . Coronary artery disease   . Diabetes mellitus without complication ([place])   . Seizures ([facility])     Medications:  Scheduled:  . azithromycin  500 mg Oral Daily  . cefdinir  300 mg Oral QODAY  . Chlorhexidine Gluconate Cloth  6 each Topical Daily  . docusate sodium  100 mg Oral BID  . furosemide  40 mg Oral Daily  . influenza vaccine adjuvanted  0.5 mL Intramuscular Tomorrow-1000  . insulin aspart  0-9 Units Subcutaneous TID AC & HS  . ipratropium-albuterol  3 mL Nebulization Q6H WA  . pantoprazole  40 mg Oral BID  . warfarin  2 mg Oral ONCE-1800  . Warfarin - Pharmacist Dosing Inpatient   Does not apply q1800    Infusions:  . sodium chloride Stopped (07/15/19 1653)    Assessment: Pharmacy consulted to restart warfarin for 85 yo female admitted with coagulopathy secondary to warfarin requiring FFP and vitamin K. Patient takes warfarin 5mg  daily as an outpatient. Patient has history significant for atrial fibrillation and stage IV chronic kidney disease. Patient receiving azithromycin and cefdinir PO.   Goal of Therapy:  INR 2-3 Monitor platelets by anticoagulation protocol: Yes   Plan:  Patient received warfarin 2mg  on 9/12. Will continue warfarin 2mg  (40% of home dose) PO x 1. Expect patient to have warfarin resistance post vitamin K administration.   Will obtain INR and CBC with am labs.   Pharmacy will continue to monitor and adjust per consult.   Simpson,Michael L 07/17/2019,10:37 AM

## 2019-07-17 NOTE — Progress Notes (Signed)
[facility]  ROUNDING NOTE   Subjective:   Patient states she is feeling well. No more melanotic stools reported.   Creatinine 4.99 (5.17) UOP 800  Objective:  Vital signs in last 24 hours:  Temp:  [97.6 F (36.4 C)-98.1 F (36.7 C)] 97.6 F (36.4 C) (09/13 0448) Pulse Rate:  [68-76] 76 (09/13 0448) Resp:  [18-20] 18 (09/13 0448) BP: (140-154)/(45-60) 154/60 (09/13 0448) SpO2:  [95 %-99 %] 95 % (09/13 0448) Weight:  [72 kg] 72 kg (09/13 0451)  Weight change: -2 kg Filed Weights   07/15/19 0403 07/16/19 0500 07/17/19 0451  Weight: 74 kg 74 kg 72 kg    Intake/Output: I/O last 3 completed shifts: In: 240 [P.O.:240] Out: 1050 [Urine:1050]   Intake/Output this shift:  Total I/O In: 240 [P.O.:240] Out: 200 [Urine:200]  Physical Exam: General: NAD, laying in bed  Head: Normocephalic, atraumatic. Moist oral mucosal membranes  Eyes: Anicteric, PERRL  Neck: Supple, trachea midline  Lungs:  clear   Heart: Irregular, +murmur  Abdomen:  Soft, nontender  Extremities: trace peripheral edema.  Neurologic: Nonfocal, moving all four extremities  Skin: No lesions        Basic Metabolic Panel: Recent Labs  Lab 07/13/19 1945 07/14/19 0947 07/15/19 1444 07/16/19 0413 07/17/19 0332  NA 138 139  --  139 140  K 4.3 4.1  --  3.4* 3.1*  CL 104 106  --  106 107  CO2 22 20*  --  23 23  GLUCOSE 218* 166*  --  131* 149*  BUN 83* 85*  --  89* 86*  CREATININE 4.96* 4.75*  --  5.17* 4.99*  CALCIUM 10.2 10.2  --  9.3 8.9  MG  --   --  2.3  --  2.1  PHOS  --   --  3.9 3.5  --     Liver Function Tests: Recent Labs  Lab 07/16/19 0413  ALBUMIN 3.4*   No results for input(s): LIPASE, AMYLASE in the last 168 hours. No results for input(s): AMMONIA in the last 168 hours.  CBC: Recent Labs  Lab 07/13/19 1945 07/14/19 0947 07/14/19 1751 07/14/19 2310 07/15/19 1444 07/16/19 0929 07/17/19 0332  WBC 5.9 8.7  --   --  9.1 8.5 8.0  NEUTROABS  --   --   --   --   7.2  --   --   HGB 5.7* 8.0* 7.9* 7.8* 8.2* 7.9* 7.4*  HCT 18.5* 24.7* 24.7* 24.5* 26.4* 25.1* 23.3*  MCV 89.8 83.7  --   --  85.7 85.7 85.0  PLT 257 245  --   --  289 255 222    Cardiac Enzymes: No results for input(s): CKTOTAL, CKMB, CKMBINDEX, TROPONINI in the last 168 hours.  BNP: Invalid input(s): POCBNP  CBG: Recent Labs  Lab 07/16/19 0816 07/16/19 1137 07/16/19 1639 07/16/19 2017 07/17/19 0735  GLUCAP 143* 153* 129* 242* 140*    Microbiology: Results for orders placed or performed during the hospital encounter of 07/13/19  SARS CORONAVIRUS 2 (TAT 6-24 HRS) Nasopharyngeal Nasopharyngeal Swab     Status: None   Collection Time: 07/13/19 10:42 PM   Specimen: Nasopharyngeal Swab  Result Value Ref Range Status   SARS Coronavirus 2 NEGATIVE NEGATIVE Final    Comment: (NOTE) SARS-CoV-2 target nucleic acids are NOT DETECTED. The SARS-CoV-2 RNA is generally detectable in upper and lower respiratory specimens during the acute phase of infection. Negative results do not preclude SARS-CoV-2 infection, do not rule out  co-infections with other pathogens, and should not be used as the sole basis for treatment or other patient management decisions. Negative results must be combined with clinical observations, patient history, and epidemiological information. The expected result is Negative. Fact Sheet for Patients: SugarRoll.be Fact Sheet for Healthcare Providers: https://www.woods-mathews.com/ This test is not yet approved or cleared by the [place] FDA and  has been authorized for detection and/or diagnosis of SARS-CoV-2 by FDA under an Emergency Use Authorization (EUA). This EUA will remain  in effect (meaning this test can be used) for the duration of the COVID-19 declaration under Section 56 4(b)(1) of the Act, 21 U.S.C. section 360bbb-3(b)(1), unless the authorization is terminated or revoked sooner. Performed at [facility], [place] [address] [place], [place] [address]   MRSA PCR Screening     Status: None   Collection Time: 07/14/19  2:28 PM  Result Value Ref Range Status   MRSA by PCR NEGATIVE NEGATIVE Final    Comment:        The GeneXpert MRSA Assay (FDA approved for NASAL specimens only), is one component of a comprehensive MRSA colonization surveillance program. It is not intended to diagnose MRSA infection nor to guide or monitor treatment for MRSA infections. Performed at [facility], [address] [place], [place] [address]     Coagulation Studies: Recent Labs    07/15/19 1444 07/17/19 0332  LABPROT 16.2* 16.3*  INR 1.3* 1.3*    Urinalysis: No results for input(s): COLORURINE, LABSPEC, PHURINE, GLUCOSEU, HGBUR, BILIRUBINUR, KETONESUR, PROTEINUR, UROBILINOGEN, NITRITE, LEUKOCYTESUR in the last 72 hours.  Invalid input(s): APPERANCEUR    Imaging: No results found.   Medications:   . sodium chloride Stopped (07/15/19 1653)   . azithromycin  500 mg Oral Daily  . Chlorhexidine Gluconate Cloth  6 each Topical Daily  . docusate sodium  100 mg Oral BID  . furosemide  40 mg Oral Daily  . influenza vaccine adjuvanted  0.5 mL Intramuscular Tomorrow-1000  . insulin aspart  0-9 Units Subcutaneous TID AC & HS  . ipratropium-albuterol  3 mL Nebulization Q6H WA  . pantoprazole  40 mg Oral BID  . potassium chloride  20 mEq Oral Once  . warfarin  2 mg Oral ONCE-1800  . Warfarin - Pharmacist Dosing Inpatient   Does not apply q1800   acetaminophen **OR** acetaminophen, bisacodyl, hydrALAZINE, HYDROcodone-acetaminophen, ondansetron **OR** ondansetron (ZOFRAN) IV, traZODone  Assessment/ Plan:  [name] is a 85 y.o. white female with diabetes mellitus type II insulin dependent, coronary artery disease, hypertension, asthma , atrial fibrillation on warfarin, who was admitted to [facility] on 07/13/2019 for hypercoaguable state with GI bleed and symptomatic  anemia. Warfarin was held and patient was given vitamin K.   Does not follow with a outpatient nephrologist.  1. Acute renal failure on chronic kidney disease stage IV. Baseline creatinine of 3.6, GFR of 12 on 06/09/19.  Chronic kidney disease secondary to diabetes mellitus type II.  Found to have nephrotic range proteinuria.  Ultrasound consistent with chronic kidney disease.  Acute renal failure secondary acute blood loss.   2. Anemia with renal failure:  Status post 3 units PRBC and FFP. With hypercoaguable state status post vitamin K.  EGD by Dr. Allen Norris on 9/11. Duodenal lesion found.   3. Hypertension: elevated. With atrial fibrillation - holding benazepril due to acute renal failure.   4. Hypokalemia:  - PO potassium replacement.    LOS: 4 Dontez Hauss 9/13/202011:59 AM

## 2019-07-17 NOTE — Progress Notes (Signed)
[facility] [place] NAME: Laura Kim    MR#:  [id number]  DATE OF BIRTH:  03-25-1930  SUBJECTIVE: Patient is seen at bedside, denies complaints  CHIEF COMPLAINT:   Chief Complaint  Patient presents with  . Weakness  Patient seen and evaluated today S/P PRBC and FFP transfusion H feeling better, hemoglobin stable around 7.9. REVIEW OF SYSTEMS:    ROS  CONSTITUTIONAL: No documented fever. No fatigue, weakness. No weight gain, no weight loss.  EYES: No blurry or double vision.  ENT: No tinnitus. No postnasal drip. No redness of the oropharynx.  RESPIRATORY: No cough, no wheeze, no hemoptysis. Has dyspnea.  CARDIOVASCULAR: No chest pain. No orthopnea. No palpitations. No syncope.  GASTROINTESTINAL: No nausea, no vomiting or diarrhea. No abdominal pain. No melena or hematochezia.  GENITOURINARY: No dysuria or hematuria.  ENDOCRINE: No polyuria or nocturia. No heat or cold intolerance.  HEMATOLOGY: No anemia. No bruising. No bleeding.  INTEGUMENTARY: No rashes. No lesions.  MUSCULOSKELETAL: No arthritis. No swelling. No gout.  NEUROLOGIC: No numbness, tingling, or ataxia. No seizure-type activity.  PSYCHIATRIC: No anxiety. No insomnia. No ADD.   DRUG ALLERGIES:   Allergies  Allergen Reactions  . Penicillins Itching, Rash and Other (See Comments)    Did it involve swelling of the face/tongue/throat, SOB, or low BP? NO Did it involve sudden or severe rash/hives, skin peeling, or any reaction on the inside of your mouth or nose? Yes Did you need to seek medical attention at a hospital or doctor's office? Unknown When did it last happen?>35 years If all above answers are "NO", may proceed with cephalosporin use.    VITALS:  Blood pressure (!) 154/60, pulse 76, temperature 97.6 F (36.4 C), temperature source Oral, resp. rate 18, height 5' (1.524 m), weight 72 kg, SpO2 95 %.  PHYSICAL EXAMINATION:   Physical Exam  GENERAL:  85  y.o.-year-old patient lying in the bed with no acute distress.  EYES: Pupils equal, round, reactive to light and accommodation. No scleral icterus. Extraocular muscles intact.  HEENT: Head atraumatic, normocephalic. Oropharynx and nasopharynx clear.  NECK:  Supple, no jugular venous distention. No thyroid enlargement, no tenderness.  LUNGS: Decreased breath sounds bilaterally,basal rales heard. No use of accessory muscles of respiration.  CARDIOVASCULAR: S1, S2 normal. No murmurs, rubs, or gallops.  ABDOMEN: Soft, nontender, nondistended. Bowel sounds present. No organomegaly or mass.  EXTREMITIES: No cyanosis, clubbing or edema b/l.    NEUROLOGIC: Cranial nerves II through XII are intact. No focal Motor or sensory deficits b/l.   PSYCHIATRIC: The patient is alert and oriented x 3.  SKIN: No obvious rash, lesion, or ulcer.   LABORATORY PANEL:   CBC Recent Labs  Lab 07/17/19 0332  WBC 8.0  HGB 7.4*  HCT 23.3*  PLT 222   ------------------------------------------------------------------------------------------------------------------ Chemistries  Recent Labs  Lab 07/17/19 0332  NA 140  K 3.1*  CL 107  CO2 23  GLUCOSE 149*  BUN 86*  CREATININE 4.99*  CALCIUM 8.9  MG 2.1   ------------------------------------------------------------------------------------------------------------------  Cardiac Enzymes No results for input(s): TROPONINI in the last 168 hours. ------------------------------------------------------------------------------------------------------------------  RADIOLOGY:  No results found.   ASSESSMENT AND PLAN:   85 year old elderly female patient with history of coronary disease, chronic atrial fibrillation on Coumadin, and type 2 diabetes mellitus currently under hospitalist service  -Coumadin coagulopathy Improved, INR 10.1 when she came, decreased to 1.3 with vitamin K, FFP, restarted the Coumadin at low-dose after discussing with  GI, restarted  Coumadin due to her history of atrial fibrillation.   -Anemia secondary to blood loss PRBC transfusions tolerated well Hemoglobin stable.  Continue IV PPI  Status post endoscopy, EGD done yesterday, showed nonbleeding angiodysplasia status post argon beam coagulation.  It is stable at 7.4.   -Acute on chronic kidney disease stage IV  Renal function improving slowly Dialysis deferred, renal function slowly is improving, creatinine trended down from 1 5.17-4.99 today.  Nephrology consultation and follow-up appreciated Renal ultrasound reviewed Shows bilateral renal atrophy Has anemia with renal failure, received a 3 units of packed RBC, FFP.  -Atrial fibrillation chronic Restarted the Coumadin at low-dose, patient still at high risk for bleeding as per gastroenterology we will continue to watch her hemoglobin while she is on Coumadin.  Discussed with patient's daughter.   acute respiratory failure with hypoxia secondary to acute on chronic renal failure: Improved, room air saturations more than 95%. Patient is on Lasix at 40 mg daily.  Deconditioning, physical therapy recommends home health physical therapy.  With 24-hour help.  Hypokalemia, replace potassium, pharmacy consulted for electrolyte management.  Right base pneumonia, patient is on cefdinir, azithromycin. More than 50% time spent in counseling, coordination of care. All the records are reviewed and case discussed with Care Management/Social Worker. Management plans discussed with the patient, family and they are in agreement.  CODE STATUS: Full code  DVT Prophylaxis: SCDs  TOTAL TIME TAKING CARE OF THIS PATIENT: 35 minutes.   POSSIBLE D/C IN 2 to 3 DAYS, DEPENDING ON CLINICAL CONDITION.  Epifanio Lesches M.D on 07/17/2019 at 11:36 AM  Between 7am to 6pm - Pager - [phone]  After 6pm go to [email or web address] - password [email or web address] [facility]  Office  [phone]  CC: Primary care physician;  Kirk Ruths, MD  Note: This dictation was prepared with Dragon dictation along with smaller phrase technology. Any transcriptional errors that result from this process are unintentional.

## 2019-07-17 NOTE — Progress Notes (Signed)
Pharmacy Electrolyte Monitoring Consult:  Pharmacy consulted to assist in monitoring and replacing electrolytes in this 85 y.o. female admitted on 07/13/2019 with GI Bleed. Patient with history significant for CAD, atrial fibrillation, diabetes, and stage IV CKD.   Labs:  Sodium (mmol/L)  Date Value  07/17/2019 140  12/24/2012 138   Potassium (mmol/L)  Date Value  07/17/2019 3.1 (L)  12/24/2012 4.2   Magnesium (mg/dL)  Date Value  07/17/2019 2.1   Phosphorus (mg/dL)  Date Value  07/16/2019 3.5   Calcium (mg/dL)  Date Value  07/17/2019 8.9   Calcium, Total (mg/dL)  Date Value  12/24/2012 9.2   Albumin (g/dL)  Date Value  07/16/2019 3.4 (L)  12/22/2012 3.8    Assessment/Plan: Patient received potassium 10mEq PO x 1 this am. Patient transitioned to  furosemide 40mg  PO Daily on 9/13.   Will order additional potassium 43mEq PO x 1.   BMP/Magnesium with am labs.   Will replace for goal potassium ~ 4 and magnesium ~ 2 in setting of atrial fibrillation. Will replace orally in setting of diuresis.   Pharmacy will continue to monitor and adjust per consult.   Simpson,Michael L 07/17/2019 10:47 AM

## 2019-07-17 NOTE — Progress Notes (Signed)
   07/17/19 1320  Clinical Encounter Type  Visited With Patient and family together  Visit Type Follow-up  Referral From Nurse  Consult/Referral To Chaplain  Spiritual Encounters  Spiritual Needs Prayer;Other (Comment)  [place] entered room and patient was resting peacefully upon hospital bed. Patient woke up to verbal stimuli. Patient's daughter was present. Completed Initial Spiritual Care Screening. Received request on AD info. [place] educated family on AD and informed them of challenges of completing form in hospital. Provided pastoral care through building rapport and prayer upon request. Pastoral visit was appreciated.

## 2019-07-18 LAB — PROTEIN ELECTROPHORESIS, SERUM
A/G Ratio: 1.5 (ref 0.7–1.7)
Albumin ELP: 3.2 g/dL (ref 2.9–4.4)
Alpha-1-Globulin: 0.2 g/dL (ref 0.0–0.4)
Alpha-2-Globulin: 0.5 g/dL (ref 0.4–1.0)
Beta Globulin: 0.7 g/dL (ref 0.7–1.3)
Gamma Globulin: 0.6 g/dL (ref 0.4–1.8)
Globulin, Total: 2.1 g/dL — ABNORMAL LOW (ref 2.2–3.9)
Total Protein ELP: 5.3 g/dL — ABNORMAL LOW (ref 6.0–8.5)

## 2019-07-18 LAB — CBC
HCT: 23.6 % — ABNORMAL LOW (ref 36.0–46.0)
Hemoglobin: 7.3 g/dL — ABNORMAL LOW (ref 12.0–15.0)
MCH: 26.4 pg (ref 26.0–34.0)
MCHC: 30.9 g/dL (ref 30.0–36.0)
MCV: 85.5 fL (ref 80.0–100.0)
Platelets: 227 10*3/uL (ref 150–400)
RBC: 2.76 MIL/uL — ABNORMAL LOW (ref 3.87–5.11)
RDW: 17.2 % — ABNORMAL HIGH (ref 11.5–15.5)
WBC: 13.6 10*3/uL — ABNORMAL HIGH (ref 4.0–10.5)
nRBC: 0 % (ref 0.0–0.2)

## 2019-07-18 LAB — HEPATITIS B SURFACE ANTIBODY,QUALITATIVE: Hep B S Ab: NONREACTIVE

## 2019-07-18 LAB — PROTIME-INR
INR: 1.6 — ABNORMAL HIGH (ref 0.8–1.2)
Prothrombin Time: 18.9 seconds — ABNORMAL HIGH (ref 11.4–15.2)

## 2019-07-18 LAB — BASIC METABOLIC PANEL
Anion gap: 8 (ref 5–15)
BUN: 77 mg/dL — ABNORMAL HIGH (ref 8–23)
CO2: 23 mmol/L (ref 22–32)
Calcium: 8.4 mg/dL — ABNORMAL LOW (ref 8.9–10.3)
Chloride: 106 mmol/L (ref 98–111)
Creatinine, Ser: 4.88 mg/dL — ABNORMAL HIGH (ref 0.44–1.00)
GFR calc Af Amer: 9 mL/min — ABNORMAL LOW (ref >60)
GFR calc non Af Amer: 7 mL/min — ABNORMAL LOW (ref >60)
Glucose, Bld: 142 mg/dL — ABNORMAL HIGH (ref 70–99)
Potassium: 3.5 mmol/L (ref 3.5–5.1)
Sodium: 137 mmol/L (ref 135–145)

## 2019-07-18 LAB — GLUCOSE, CAPILLARY
Glucose-Capillary: 140 mg/dL — ABNORMAL HIGH (ref 70–99)
Glucose-Capillary: 167 mg/dL — ABNORMAL HIGH (ref 70–99)
Glucose-Capillary: 182 mg/dL — ABNORMAL HIGH (ref 70–99)
Glucose-Capillary: 193 mg/dL — ABNORMAL HIGH (ref 70–99)

## 2019-07-18 LAB — MAGNESIUM: Magnesium: 2.1 mg/dL (ref 1.7–2.4)

## 2019-07-18 LAB — HEPATITIS B CORE ANTIBODY, IGM: Hep B C IgM: NEGATIVE

## 2019-07-18 LAB — HEMOGLOBIN A1C
Hgb A1c MFr Bld: 5.1 % (ref 4.8–5.6)
Mean Plasma Glucose: 100 mg/dL

## 2019-07-18 LAB — HEPATITIS C ANTIBODY: HCV Ab: <0.1 s/co ratio (ref 0.0–0.9)

## 2019-07-18 LAB — HEPATITIS B SURFACE ANTIGEN: Hepatitis B Surface Ag: NEGATIVE

## 2019-07-18 MED ORDER — WARFARIN SODIUM 2 MG PO TABS
2.0000 mg | ORAL_TABLET | Freq: Once | ORAL | Status: AC
  Administered 2019-07-18: 2 mg via ORAL
  Filled 2019-07-18: qty 1

## 2019-07-18 MED ORDER — POTASSIUM CHLORIDE CRYS ER 20 MEQ PO TBCR
20.0000 meq | EXTENDED_RELEASE_TABLET | Freq: Once | ORAL | Status: AC
  Administered 2019-07-18: 20 meq via ORAL
  Filled 2019-07-18: qty 1

## 2019-07-18 NOTE — Care Management Important Message (Signed)
Important Message  Patient Details  Name: Laura Kim MRN: [id number] Date of Birth: [birth year]   Medicare Important Message Given:  Yes     Dannette Bani 07/18/2019, 11:27 AM

## 2019-07-18 NOTE — Progress Notes (Signed)
[place] for warfarin dosing  Indication: atrial fibrillation; patient with coagulopathy this admission requiring FFP and Vitamin K.   Allergies  Allergen Reactions  . Penicillins Itching, Rash and Other (See Comments)    Did it involve swelling of the face/tongue/throat, SOB, or low BP? NO Did it involve sudden or severe rash/hives, skin peeling, or any reaction on the inside of your mouth or nose? Yes Did you need to seek medical attention at a hospital or doctor's office? Unknown When did it last happen?>35 years If all above answers are "NO", may proceed with cephalosporin use.    Patient Measurements: Height: 5' (152.4 cm) Weight: 157 lb 13.6 oz (71.6 kg) IBW/kg (Calculated) : 45.5  Vital Signs: Temp: 98.9 F (37.2 C) (09/13 2139) Temp Source: Oral (09/13 2139) BP: 163/55 (09/13 2139) Pulse Rate: 86 (09/13 2139)  Labs: Recent Labs    07/15/19 1444 07/16/19 0413 07/16/19 0929 07/17/19 0332 07/18/19 0336  HGB 8.2*  --  7.9* 7.4* 7.3*  HCT 26.4*  --  25.1* 23.3* 23.6*  PLT 289  --  255 222 227  LABPROT 16.2*  --   --  16.3* 18.9*  INR 1.3*  --   --  1.3* 1.6*  CREATININE  --  5.17*  --  4.99* 4.88*    Estimated Creatinine Clearance: 7.3 mL/min (A) (by C-G formula based on SCr of 4.88 mg/dL (H)).   Medical History: Past Medical History:  Diagnosis Date  . Coronary artery disease   . Diabetes mellitus without complication ([place])   . Seizures (HCC)     Medications:  Scheduled:  . azithromycin  500 mg Oral Daily  . docusate sodium  100 mg Oral BID  . furosemide  40 mg Oral Daily  . influenza vaccine adjuvanted  0.5 mL Intramuscular Tomorrow-1000  . insulin aspart  0-9 Units Subcutaneous TID AC & HS  . ipratropium-albuterol  3 mL Nebulization Q6H WA  . pantoprazole  40 mg Oral BID  . potassium chloride  20 mEq Oral Once  . Warfarin - Pharmacist Dosing Inpatient   Does not apply q1800   Infusions:  . sodium chloride  Stopped (07/15/19 1653)    Assessment: Pharmacy consulted to restart warfarin for 85 yo female admitted with coagulopathy secondary to warfarin requiring FFP and vitamin K. Patient takes warfarin 5mg  daily as an outpatient. Patient has history significant for atrial fibrillation and stage IV chronic kidney disease. Patient receiving azithromycin and cefdinir PO.  (per Consult order: please start Coumadin at a very low dose secondary to recent GI bleed)  9/13 INR 1.3  Warfarin 2 mg 9/14 INR 1.6  Goal of Therapy:  INR 2-3 Monitor platelets by anticoagulation protocol: Yes   Plan:  Will continue warfarin 2 mg (40% of home dose) PO x 1. Expect patient to have warfarin resistance post vitamin K administration.   Will obtain INR and CBC with am labs.   Pharmacy will continue to monitor and adjust per consult.   Laura Kim A 07/18/2019,8:43 AM

## 2019-07-18 NOTE — Progress Notes (Signed)
Family Meeting Note  Advance Directive: No  Today a meeting took place with the Patient.  Patient is able to participate in decision, she wanted to be DNR. Spoke with patient's daughter yesterday, wanted to be DNR, wanted official  paperwork started here.     The following were discussed:Patient's diagnosis: , Discussed about plan of discharge with patient, hopefully if the INR is close to 2 possible discharge home tomorrow or day after. Time spent during discussion: 16 minutes  Epifanio Lesches, MD

## 2019-07-18 NOTE — Progress Notes (Addendum)
[facility] [place] NAME: Charlene Hasbun    MR#:  [id number]  DATE OF BIRTH:  04/18/1930  SUBJECTIVE: Seen at bedside, coughing today, states that she choked on the pills last night still has some cough because of.  Denies any other complaints.  CHIEF COMPLAINT:   Chief Complaint  Patient presents with   Weakness  Patient seen and evaluated today S/P PRBC and FFP transfusion H feeling better, hemoglobin stable around 7.9. REVIEW OF SYSTEMS:    ROS  CONSTITUTIONAL: No documented fever. No fatigue, weakness. No weight gain, no weight loss.  EYES: No blurry or double vision.  ENT: No tinnitus. No postnasal drip. No redness of the oropharynx.  RESPIRATORY: No cough, no wheeze, no hemoptysis. Has dyspnea.  CARDIOVASCULAR: No chest pain. No orthopnea. No palpitations. No syncope.  GASTROINTESTINAL: No nausea, no vomiting or diarrhea. No abdominal pain. No melena or hematochezia.  GENITOURINARY: No dysuria or hematuria.  ENDOCRINE: No polyuria or nocturia. No heat or cold intolerance.  HEMATOLOGY: No anemia. No bruising. No bleeding.  INTEGUMENTARY: No rashes. No lesions.  MUSCULOSKELETAL: No arthritis. No swelling. No gout.  NEUROLOGIC: No numbness, tingling, or ataxia. No seizure-type activity.  PSYCHIATRIC: No anxiety. No insomnia. No ADD.   DRUG ALLERGIES:   Allergies  Allergen Reactions   Penicillins Itching, Rash and Other (See Comments)    Did it involve swelling of the face/tongue/throat, SOB, or low BP? NO Did it involve sudden or severe rash/hives, skin peeling, or any reaction on the inside of your mouth or nose? Yes Did you need to seek medical attention at a hospital or doctor's office? Unknown When did it last happen? >35 years If all above answers are "NO", may proceed with cephalosporin use.    VITALS:  Blood pressure (!) 139/54, pulse 69, temperature 98.4 F (36.9 C), temperature source Oral, resp. rate 16, height 5'  (1.524 m), weight 71.6 kg, SpO2 94 %.  PHYSICAL EXAMINATION:   Physical Exam  GENERAL:  85 y.o.-year-old patient lying in the bed with no acute distress.  EYES: Pupils equal, round, reactive to light and accommodation. No scleral icterus. Extraocular muscles intact.  HEENT: Head atraumatic, normocephalic. Oropharynx and nasopharynx clear.  NECK:  Supple, no jugular venous distention. No thyroid enlargement, no tenderness.  LUNGS: clear to auscultation. CARDIOVASCULAR: S1, S2 normal. No murmurs, rubs, or gallops.  ABDOMEN: Soft, nontender, nondistended. Bowel sounds present. No organomegaly or mass.  EXTREMITIES: No cyanosis, clubbing or edema b/l.    NEUROLOGIC: Cranial nerves II through XII are intact. No focal Motor or sensory deficits b/l.   PSYCHIATRIC: The patient is alert and oriented x 3.  SKIN: No obvious rash, lesion, or ulcer.   LABORATORY PANEL:   CBC Recent Labs  Lab 07/18/19 0336  WBC 13.6*  HGB 7.3*  HCT 23.6*  PLT 227   ------------------------------------------------------------------------------------------------------------------ Chemistries  Recent Labs  Lab 07/18/19 0336  NA 137  K 3.5  CL 106  CO2 23  GLUCOSE 142*  BUN 77*  CREATININE 4.88*  CALCIUM 8.4*  MG 2.1   ------------------------------------------------------------------------------------------------------------------  Cardiac Enzymes No results for input(s): TROPONINI in the last 168 hours. ------------------------------------------------------------------------------------------------------------------  RADIOLOGY:  Dg Chest Port 1 View  Result Date: 07/17/2019 CLINICAL DATA:  Pneumonia. EXAM: PORTABLE CHEST 1 VIEW COMPARISON:  07/15/2019 and prior chest radiographs FINDINGS: Cardiomegaly, pulmonary vascular congestion and CABG changes again noted. RIGHT basilar opacity has slightly decreased. No pneumothorax noted. LEFT basilar opacity/atelectasis with possible  small LEFT pleural  effusion noted. IMPRESSION: Slightly decreased RIGHT basilar opacity/pneumonia. Otherwise unchanged appearance of the chest with cardiomegaly and pulmonary vascular congestion. Electronically Signed   By: Margarette Canada M.D.   On: 07/17/2019 14:45     ASSESSMENT AND PLAN:   85 year old elderly female patient with history of coronary disease, chronic atrial fibrillation on Coumadin, and type 2 diabetes mellitus currently under hospitalist service  -Coumadin coagulopathy Improved, INR 10.1 when she came, decreased to 1.3 with vitamin K, FFP, restarted the Coumadin at low-dose after discussing with GI, restarted Coumadin due to her history of atrial fibrillation. Patient's INR 1.6, hemoglobin 7.3 today.  -Anemia secondary to blood loss PRBC transfusions tolerated well Hemoglobin stable.  Continue IV PPI, hemoglobin stable around 7.3.  Status post endoscopy, EGD done yesterday, showed nonbleeding angiodysplasia status post argon beam coagulation.  It is stable at 7.4.   -Acute on chronic kidney disease stage IV  Renal function improving slowly, creatinine trended down to 4.8 today. Dialysis deferred, renal function slowly is improving,  Nephrology consultation and follow-up appreciated Renal ultrasound reviewed Shows bilateral renal atrophy Has anemia with renal failure, received a 3 units of packed RBC, FFP.  -Atrial fibrillation chronic Restarted the Coumadin at low-dose, patient still at high risk for bleeding as per gastroenterology we will continue to watch her hemoglobin while she is on Coumadin.  Discussed with patient's daughter.   acute respiratory failure with hypoxia secondary to acute on chronic renal failure: Improved, room air saturations more than 95%. Patient is on Lasix at 40 mg daily. Deconditioning, physical therapy recommends home health physical therapy.  With 24-hour help.  Hypokalemia, replace potassium, pharmacy consulted for electrolyte management.  Right base  pneumonia, patient is on cefdinir, azithromycin.chest xray yesterday shows improvement in right base pneumonia. More than 50% time spent in counseling, coordination of care. All the records are reviewed and case discussed with Care Management/Social Worker. Management plans discussed with the patient, family and they are in agreement.  CODE STATUS: DNR( spoke to pt and pts daughter and confirmed DNR.  DVT Prophylaxis: SCDs  TOTAL TIME TAKING CARE OF THIS PATIENT: 35 minutes.   POSSIBLE D/C IN 2 to 3 DAYS, DEPENDING ON CLINICAL CONDITION.  Epifanio Lesches M.D on 07/18/2019 at 3:10 PM  Between 7am to 6pm - Pager - [phone]  After 6pm go to [email or web address] - password [email or web address] [facility]  Office  [phone]  CC: Primary care physician; Kirk Ruths, MD  Note: This dictation was prepared with Dragon dictation along with smaller phrase technology. Any transcriptional errors that result from this process are unintentional.

## 2019-07-18 NOTE — Progress Notes (Signed)
Pharmacy Electrolyte Monitoring Consult:  Pharmacy consulted to assist in monitoring and replacing electrolytes in this 85 y.o. female admitted on 07/13/2019 with GI Bleed. Patient with history significant for CAD, atrial fibrillation, diabetes, and stage IV CKD.   Labs:  Sodium (mmol/L)  Date Value  07/18/2019 137  12/24/2012 138   Potassium (mmol/L)  Date Value  07/18/2019 3.5  12/24/2012 4.2   Magnesium (mg/dL)  Date Value  07/18/2019 2.1   Phosphorus (mg/dL)  Date Value  07/16/2019 3.5   Calcium (mg/dL)  Date Value  07/18/2019 8.4 (L)   Calcium, Total (mg/dL)  Date Value  12/24/2012 9.2   Albumin (g/dL)  Date Value  07/16/2019 3.4 (L)  12/22/2012 3.8    Assessment/Plan: 9/14 K 3.5  Mag 2.1  Scr 4.88 Patient on furosemide 40mg  PO Daily started 9/13.   Will order additional potassium 10mEq PO x 1. watch for conservative replacement given renal fxn BMP with am labs.   Will replace for goal potassium ~ 4 and magnesium ~ 2 in setting of atrial fibrillation. Will replace orally in setting of diuresis.   Pharmacy will continue to monitor and adjust per consult.   Laura Kim A 07/18/2019 8:36 AM

## 2019-07-19 LAB — CBC
HCT: 24 % — ABNORMAL LOW (ref 36.0–46.0)
Hemoglobin: 7.4 g/dL — ABNORMAL LOW (ref 12.0–15.0)
MCH: 26.3 pg (ref 26.0–34.0)
MCHC: 30.8 g/dL (ref 30.0–36.0)
MCV: 85.4 fL (ref 80.0–100.0)
Platelets: 196 10*3/uL (ref 150–400)
RBC: 2.81 MIL/uL — ABNORMAL LOW (ref 3.87–5.11)
RDW: 17.1 % — ABNORMAL HIGH (ref 11.5–15.5)
WBC: 8.3 10*3/uL (ref 4.0–10.5)
nRBC: 0 % (ref 0.0–0.2)

## 2019-07-19 LAB — PROTEIN ELECTRO, RANDOM URINE
Albumin ELP, Urine: 58 %
Alpha-1-Globulin, U: 5.5 %
Alpha-2-Globulin, U: 7.5 %
Beta Globulin, U: 15.8 %
Gamma Globulin, U: 13.2 %
Total Protein, Urine: 131.6 mg/dL

## 2019-07-19 LAB — PROTIME-INR
INR: 1.6 — ABNORMAL HIGH (ref 0.8–1.2)
Prothrombin Time: 18.5 seconds — ABNORMAL HIGH (ref 11.4–15.2)

## 2019-07-19 LAB — GLUCOSE, CAPILLARY
Glucose-Capillary: 131 mg/dL — ABNORMAL HIGH (ref 70–99)
Glucose-Capillary: 191 mg/dL — ABNORMAL HIGH (ref 70–99)
Glucose-Capillary: 140 mg/dL — ABNORMAL HIGH (ref 70–99)
Glucose-Capillary: 151 mg/dL — ABNORMAL HIGH (ref 70–99)

## 2019-07-19 LAB — BASIC METABOLIC PANEL
Anion gap: 11 (ref 5–15)
BUN: 75 mg/dL — ABNORMAL HIGH (ref 8–23)
CO2: 20 mmol/L — ABNORMAL LOW (ref 22–32)
Calcium: 8.3 mg/dL — ABNORMAL LOW (ref 8.9–10.3)
Chloride: 106 mmol/L (ref 98–111)
Creatinine, Ser: 4.58 mg/dL — ABNORMAL HIGH (ref 0.44–1.00)
GFR calc Af Amer: 9 mL/min — ABNORMAL LOW (ref >60)
GFR calc non Af Amer: 8 mL/min — ABNORMAL LOW (ref >60)
Glucose, Bld: 143 mg/dL — ABNORMAL HIGH (ref 70–99)
Potassium: 3.4 mmol/L — ABNORMAL LOW (ref 3.5–5.1)
Sodium: 137 mmol/L (ref 135–145)

## 2019-07-19 MED ORDER — POTASSIUM CHLORIDE 20 MEQ PO PACK
40.0000 meq | PACK | Freq: Three times a day (TID) | ORAL | Status: AC
  Administered 2019-07-19 (×2): 40 meq via ORAL
  Filled 2019-07-19 (×2): qty 2

## 2019-07-19 MED ORDER — WARFARIN SODIUM 3 MG PO TABS
3.0000 mg | ORAL_TABLET | Freq: Once | ORAL | Status: AC
  Administered 2019-07-19: 3 mg via ORAL
  Filled 2019-07-19: qty 1

## 2019-07-19 NOTE — Progress Notes (Signed)
[place] for warfarin dosing  Indication: atrial fibrillation; patient with coagulopathy this admission requiring FFP and Vitamin K.   Patient Measurements: Height: 5' (152.4 cm) Weight: 159 lb 6.3 oz (72.3 kg) IBW/kg (Calculated) : 45.5  Vital Signs: Temp: 98 F (36.7 C) (09/15 0554) Temp Source: Oral (09/15 0554) BP: 145/60 (09/15 0554) Pulse Rate: 81 (09/15 0554)  Labs: Recent Labs    07/16/19 0929 07/17/19 0332 07/18/19 0336 07/19/19 0606  HGB 7.9* 7.4* 7.3*  --   HCT 25.1* 23.3* 23.6*  --   PLT 255 222 227  --   LABPROT  --  16.3* 18.9* 18.5*  INR  --  1.3* 1.6* 1.6*  CREATININE  --  4.99* 4.88* 4.58*    Estimated Creatinine Clearance: 7.8 mL/min (A) (by C-G formula based on SCr of 4.58 mg/dL (H)).   Medical History: Past Medical History:  Diagnosis Date  . Coronary artery disease   . Diabetes mellitus without complication ([place])   . Seizures (HCC)     Medications:  Scheduled:  . azithromycin  500 mg Oral Daily  . docusate sodium  100 mg Oral BID  . furosemide  40 mg Oral Daily  . influenza vaccine adjuvanted  0.5 mL Intramuscular Tomorrow-1000  . insulin aspart  0-9 Units Subcutaneous TID AC & HS  . ipratropium-albuterol  3 mL Nebulization Q6H WA  . pantoprazole  40 mg Oral BID  . Warfarin - Pharmacist Dosing Inpatient   Does not apply q1800   Infusions:  . sodium chloride Stopped (07/15/19 1653)    Assessment: Pharmacy consulted to restart warfarin for 85 yo female admitted with coagulopathy (INR 10.1) secondary to warfarin requiring FFP and vitamin K (10mg  IV 9/9). Patient takes warfarin 5mg  daily as an outpatient. Patient has history significant for atrial fibrillation and stage IV chronic kidney disease. Patient is receiving azithromycin until 9/16.  (per Consult order: please start Coumadin at a very low dose secondary to recent GI bleed)  Date   INR   Dose 9/12      2 mg 9/13   1.3   2 mg 9/14   1.6   2  mg 9/15   1.6   3 mg   Goal of Therapy:  INR 2-3 Monitor platelets by anticoagulation protocol: Yes   Plan:   INR subtherapeutic with non-continuation of uptrend  warfarin 3 mg (50% of home dose) PO x 1.   Expect patient to have continued warfarin resistance post vitamin K administration.   Will obtain INR and CBC with am labs.   Pharmacy will continue to monitor and adjust per consult.   Dallie Piles, PharmD 07/19/2019,7:26 AM

## 2019-07-19 NOTE — Progress Notes (Signed)
[facility]  ROUNDING NOTE   Subjective:   Feels well today Sitting up in the chair Daughter Laura Kim, at bedside Denies any acute complaints  Objective:  Vital signs in last 24 hours:  Temp:  [98 F (36.7 C)-98.6 F (37 C)] 98.6 F (37 C) (09/15 1221) Pulse Rate:  [72-83] 72 (09/15 1221) Resp:  [16-20] 16 (09/15 1221) BP: (144-149)/(53-60) 144/53 (09/15 1221) SpO2:  [94 %-96 %] 95 % (09/15 1300) Weight:  [72.3 kg] 72.3 kg (09/15 0500)  Weight change: 0.7 kg Filed Weights   07/17/19 0451 07/18/19 0500 07/19/19 0500  Weight: 72 kg 71.6 kg 72.3 kg    Intake/Output: I/O last 3 completed shifts: In: 360 [P.O.:360] Out: 153 [Urine:153]   Intake/Output this shift:  Total I/O In: 240 [P.O.:240] Out: 250 [Urine:250]  Physical Exam: General: NAD, sitting up in chair at the side of the bed  Head: Normocephalic, atraumatic. Moist oral mucosal membranes  Eyes: Anicteric,  Neck: Supple,   Lungs:  clear to auscultation  Heart: Irregular, +murmur  Abdomen:  Soft, nontender  Extremities: trace peripheral edema.  Neurologic:  Alert, oriented, able to answer questions, somewhat forgetful  Skin: No lesions        Basic Metabolic Panel: Recent Labs  Lab 07/14/19 0947 07/15/19 1444 07/16/19 0413 07/17/19 0332 07/17/19 1602 07/18/19 0336 07/19/19 0606  NA 139  --  139 140  --  137 137  K 4.1  --  3.4* 3.1* 3.8 3.5 3.4*  CL 106  --  106 107  --  106 106  CO2 20*  --  23 23  --  23 20*  GLUCOSE 166*  --  131* 149*  --  142* 143*  BUN 85*  --  89* 86*  --  77* 75*  CREATININE 4.75*  --  5.17* 4.99*  --  4.88* 4.58*  CALCIUM 10.2  --  9.3 8.9  --  8.4* 8.3*  MG  --  2.3  --  2.1 2.1 2.1  --   PHOS  --  3.9 3.5  --   --   --   --     Liver Function Tests: Recent Labs  Lab 07/16/19 0413  ALBUMIN 3.4*   No results for input(s): LIPASE, AMYLASE in the last 168 hours. No results for input(s): AMMONIA in the last 168 hours.  CBC: Recent Labs  Lab  07/15/19 1444 07/16/19 0929 07/17/19 0332 07/18/19 0336 07/19/19 1118  WBC 9.1 8.5 8.0 13.6* 8.3  NEUTROABS 7.2  --   --   --   --   HGB 8.2* 7.9* 7.4* 7.3* 7.4*  HCT 26.4* 25.1* 23.3* 23.6* 24.0*  MCV 85.7 85.7 85.0 85.5 85.4  PLT 289 255 222 227 196    Cardiac Enzymes: No results for input(s): CKTOTAL, CKMB, CKMBINDEX, TROPONINI in the last 168 hours.  BNP: Invalid input(s): POCBNP  CBG: Recent Labs  Lab 07/18/19 1141 07/18/19 1630 07/18/19 2116 07/19/19 0737 07/19/19 1151  GLUCAP 167* 182* 193* 131* 191*    Microbiology: Results for orders placed or performed during the hospital encounter of 07/13/19  SARS CORONAVIRUS 2 (TAT 6-24 HRS) Nasopharyngeal Nasopharyngeal Swab     Status: None   Collection Time: 07/13/19 10:42 PM   Specimen: Nasopharyngeal Swab  Result Value Ref Range Status   SARS Coronavirus 2 NEGATIVE NEGATIVE Final    Comment: (NOTE) SARS-CoV-2 target nucleic acids are NOT DETECTED. The SARS-CoV-2 RNA is generally detectable in upper and lower respiratory  specimens during the acute phase of infection. Negative results do not preclude SARS-CoV-2 infection, do not rule out co-infections with other pathogens, and should not be used as the sole basis for treatment or other patient management decisions. Negative results must be combined with clinical observations, patient history, and epidemiological information. The expected result is Negative. Fact Sheet for Patients: SugarRoll.be Fact Sheet for Healthcare Providers: https://www.woods-mathews.com/ This test is not yet approved or cleared by the [place] FDA and  has been authorized for detection and/or diagnosis of SARS-CoV-2 by FDA under an Emergency Use Authorization (EUA). This EUA will remain  in effect (meaning this test can be used) for the duration of the COVID-19 declaration under Section 56 4(b)(1) of the Act, 21 U.S.C. section 360bbb-3(b)(1),  unless the authorization is terminated or revoked sooner. Performed at [facility], [address], [place], [place] [address]   MRSA PCR Screening     Status: None   Collection Time: 07/14/19  2:28 PM  Result Value Ref Range Status   MRSA by PCR NEGATIVE NEGATIVE Final    Comment:        The GeneXpert MRSA Assay (FDA approved for NASAL specimens only), is one component of a comprehensive MRSA colonization surveillance program. It is not intended to diagnose MRSA infection nor to guide or monitor treatment for MRSA infections. Performed at [facility], [place] [place],  [address]     Coagulation Studies: Recent Labs    07/17/19 0332 07/18/19 0336 07/19/19 0606  LABPROT 16.3* 18.9* 18.5*  INR 1.3* 1.6* 1.6*    Urinalysis: No results for input(s): COLORURINE, LABSPEC, PHURINE, GLUCOSEU, HGBUR, BILIRUBINUR, KETONESUR, PROTEINUR, UROBILINOGEN, NITRITE, LEUKOCYTESUR in the last 72 hours.  Invalid input(s): APPERANCEUR    Imaging: No results found.   Medications:   . sodium chloride Stopped (07/15/19 1653)   . docusate sodium  100 mg Oral BID  . furosemide  40 mg Oral Daily  . insulin aspart  0-9 Units Subcutaneous TID AC & HS  . ipratropium-albuterol  3 mL Nebulization Q6H WA  . pantoprazole  40 mg Oral BID  . potassium chloride  40 mEq Oral TID  . warfarin  3 mg Oral ONCE-1800  . Warfarin - Pharmacist Dosing Inpatient   Does not apply q1800   acetaminophen **OR** acetaminophen, bisacodyl, hydrALAZINE, HYDROcodone-acetaminophen, ondansetron **OR** ondansetron (ZOFRAN) IV, phenol, traZODone  Assessment/ Plan:  [name] is a 85 y.o. white female with diabetes mellitus type II insulin dependent, coronary artery disease, hypertension, asthma , atrial fibrillation on warfarin, who was admitted to [facility] on 07/13/2019 for hypercoaguable state with GI bleed and symptomatic anemia. Warfarin was held and patient was given vitamin  K.   Does not follow with a outpatient nephrologist.  1. Acute renal failure on chronic kidney disease stage IV.  Baseline creatinine of 3.6, GFR of 12 on 06/09/19.  Chronic kidney disease secondary to diabetes mellitus type II.  Found to have nephrotic range proteinuria.  Ultrasound consistent with chronic kidney disease.  Acute renal failure like prerenal and ATN Discussed possibility of progression of chronic kidney disease and natural course of kidney disease with patient and her daughter.  Brought up the possibility needing dialysis in the near future.  Patient and daughter want to discuss more with family.  Patient wants to be a family decision.  2. Anemia with renal failure:  Status post 3 units PRBC and FFP. With hypercoaguable state status post vitamin K.  EGD  by Dr. Allen Norris on 9/11. Duodenal lesion found.   3. Hypertension: elevated. With atrial fibrillation - holding benazepril due to acute renal failure.   4. Hypokalemia:  - PO potassium replacement.    LOS: 6 Laura Kim 9/15/20204:06 PM

## 2019-07-19 NOTE — TOC Initial Note (Addendum)
Transition of Care [facility]) - Initial/Assessment Note    Patient Details  Name: Laura Kim MRN: [id number] Date of Birth: 05/02/1930  [facility] CM/SW Contact:    Beverly Sessions, RN Phone Number: 07/19/2019, 10:48 AM  Clinical Narrative:                 Patient admitted from home with Anemia.  Patient sleeping soundly at this time.  RNCM attempted to call both daughters listed. Voicemail left for Ms. Mancel Bale.  Unable to leave VM for Ms. Schrone.    Per chart review patient lives at home with husband.   Daughters plan to assist with staying with patient after discharge.  PCP - [facility]  PT has assessed patient and recommends home health OT.  Patient has cane, and shower seat in the home.   RNCM to follow up with patient and family to see if they are agreeable to home health  UPDATE:  Daughter at bedside.  Daughter and patient agreeable to home health services.  CMS Medicare.gov Compare Post Acute Care list reviewed with daughter.  [facility] selected.  Referral made to [facility] with [place]. Daughter provided [facility] list.   PT will need RW at discharge. Referral made to [facility] with [place]      Expected Discharge Plan: [place]     Patient Goals and CMS Choice        Expected Discharge Plan and Services Expected Discharge Plan: [place]                                              Prior Living Arrangements/Services   Lives with:: Spouse              Current home services: DME    Activities of Daily Living Home Assistive Devices/Equipment: None ADL Screening (condition at time of admission) Patient's cognitive ability adequate to safely complete daily activities?: Yes Is the patient deaf or have difficulty hearing?: No Does the patient have difficulty seeing, even when wearing glasses/contacts?: No Does the patient have difficulty concentrating,  remembering, or making decisions?: No Patient able to express need for assistance with ADLs?: Yes Does the patient have difficulty dressing or bathing?: No Independently performs ADLs?: Yes (appropriate for developmental age) Does the patient have difficulty walking or climbing stairs?: No Weakness of Legs: None Weakness of Arms/Hands: None  Permission Sought/Granted                  Emotional Assessment         Alcohol / Substance Use: Not Applicable Psych Involvement: No (comment)  Admission diagnosis:  Shortness of breath [R06.02] ARF (acute renal failure) (HCC) [N17.9] Upper GI bleed [K92.2] AKI (acute kidney injury) ([place]) [N17.9] Elevated international normalized ratio (INR) [R79.1] Patient Active Problem List   Diagnosis Date Noted  . Melena   . Duodenal arteriovenous malformation   . Upper GI bleed   . Anemia 07/13/2019   PCP:  Kirk Ruths, MD Pharmacy:   [facility] [place] [address] Phone: [phone] Fax: [phone]  [facility] [place], [place] [address] [place] [address] Phone: [phone] Fax: [phone]  Social Determinants of Health (SDOH) Interventions    Readmission Risk Interventions No flowsheet data found.

## 2019-07-19 NOTE — Progress Notes (Signed)
[place] [facility] [place] NAME: Laura Kim    MR#:  [id number]  DATE OF BIRTH:  20-Jul-1930  SUBJECTIVE: Seen at bedside, denies any complaints, eager to go home.  CHIEF COMPLAINT:   Chief Complaint  Patient presents with  . Weakness  Patient seen and evaluated today S/P PRBC and FFP transfusion H feeling better, hemoglobin stable around 7.9. REVIEW OF SYSTEMS:    ROS  CONSTITUTIONAL: No documented fever. No fatigue, weakness. No weight gain, no weight loss.  EYES: No blurry or double vision.  ENT: No tinnitus. No postnasal drip. No redness of the oropharynx.  RESPIRATORY: No cough, no wheeze, no hemoptysis. Has dyspnea.  CARDIOVASCULAR: No chest pain. No orthopnea. No palpitations. No syncope.  GASTROINTESTINAL: No nausea, no vomiting or diarrhea. No abdominal pain. No melena or hematochezia.  GENITOURINARY: No dysuria or hematuria.  ENDOCRINE: No polyuria or nocturia. No heat or cold intolerance.  HEMATOLOGY: No anemia. No bruising. No bleeding.  INTEGUMENTARY: No rashes. No lesions.  MUSCULOSKELETAL: No arthritis. No swelling. No gout.  NEUROLOGIC: No numbness, tingling, or ataxia. No seizure-type activity.  PSYCHIATRIC: No anxiety. No insomnia. No ADD.   DRUG ALLERGIES:   Allergies  Allergen Reactions  . Penicillins Itching, Rash and Other (See Comments)    Did it involve swelling of the face/tongue/throat, SOB, or low BP? NO Did it involve sudden or severe rash/hives, skin peeling, or any reaction on the inside of your mouth or nose? Yes Did you need to seek medical attention at a hospital or doctor's office? Unknown When did it last happen?>35 years If all above answers are "NO", may proceed with cephalosporin use.    VITALS:  Blood pressure (!) 145/60, pulse 81, temperature 98 F (36.7 C), temperature source Oral, resp. rate 18, height 5' (1.524 m), weight 72.3 kg, SpO2 94 %.  PHYSICAL EXAMINATION:   Physical  Exam  GENERAL:  85 y.o.-year-old patient lying in the bed with no acute distress.  EYES: Pupils equal, round, reactive to light and accommodation. No scleral icterus. Extraocular muscles intact.  HEENT: Head atraumatic, normocephalic. Oropharynx and nasopharynx clear.  NECK:  Supple, no jugular venous distention. No thyroid enlargement, no tenderness.  LUNGS: clear to auscultation. CARDIOVASCULAR: S1, S2 normal. No murmurs, rubs, or gallops.  ABDOMEN: Soft, nontender, nondistended. Bowel sounds present. No organomegaly or mass.  EXTREMITIES: No cyanosis, clubbing or edema b/l.    NEUROLOGIC: Cranial nerves II through XII are intact. No focal Motor or sensory deficits b/l.   PSYCHIATRIC: The patient is alert and oriented x 3.  SKIN: No obvious rash, lesion, or ulcer.   LABORATORY PANEL:   CBC Recent Labs  Lab 07/18/19 0336  WBC 13.6*  HGB 7.3*  HCT 23.6*  PLT 227   ------------------------------------------------------------------------------------------------------------------ Chemistries  Recent Labs  Lab 07/18/19 0336 07/19/19 0606  NA 137 137  K 3.5 3.4*  CL 106 106  CO2 23 20*  GLUCOSE 142* 143*  BUN 77* 75*  CREATININE 4.88* 4.58*  CALCIUM 8.4* 8.3*  MG 2.1  --    ------------------------------------------------------------------------------------------------------------------  Cardiac Enzymes No results for input(s): TROPONINI in the last 168 hours. ------------------------------------------------------------------------------------------------------------------  RADIOLOGY:  Dg Chest Port 1 View  Result Date: 07/17/2019 CLINICAL DATA:  Pneumonia. EXAM: PORTABLE CHEST 1 VIEW COMPARISON:  07/15/2019 and prior chest radiographs FINDINGS: Cardiomegaly, pulmonary vascular congestion and CABG changes again noted. RIGHT basilar opacity has slightly decreased. No pneumothorax noted. LEFT basilar opacity/atelectasis with possible small LEFT pleural  effusion noted.  IMPRESSION: Slightly decreased RIGHT basilar opacity/pneumonia. Otherwise unchanged appearance of the chest with cardiomegaly and pulmonary vascular congestion. Electronically Signed   By: Margarette Canada M.D.   On: 07/17/2019 14:45     ASSESSMENT AND PLAN:   85 year old elderly female patient with history of coronary disease, chronic atrial fibrillation on Coumadin, and type 2 diabetes mellitus currently under hospitalist service  -Coumadin coagulopathy Improved, INR 10.1 when she came, decreased to 1.3 with vitamin K, FFP, restarted the Coumadin at low-dose after discussing with GI, restarted Coumadin due to her history of atrial fibrillation. Patient's INR 1.6, hemoglobin 7.3 today.  -Anemia secondary to blood loss PRBC transfusions tolerated well Hemoglobin stable.  Continue IV PPI, hemoglobin stable around 7.3.  Status post endoscopy, EGD done yesterday, showed nonbleeding angiodysplasia status post argon beam coagulation.  It is stable at 7.4.   -Acute on chronic kidney disease stage IV  Renal function improving slowly, creatinine trended down to 4.58 today.  Dialysis deferred, renal function slowly is improving,  Nephrology consultation and follow-up appreciated Renal ultrasound reviewed Shows bilateral renal atrophy Has anemia with renal failure, received a 3 units of packed RBC, FFP.  -Atrial fibrillation chronic Restarted the Coumadin at low-dose, patient still at high risk for bleeding as per gastroenterology we will continue to watch her hemoglobin while she is on Coumadin.  Discussed with patient's daughter.  INR today is 1.6.   acute respiratory failure with hypoxia secondary to acute on chronic renal failure: Improved, room air saturations more than 95%. Patient is on Lasix at 40 mg daily.   Deconditioning, physical therapy recommends home health physical therapy.  With 24-hour help.  Possible discharge home tomorrow.  Hypokalemia, replace potassium, pharmacy consulted  for electrolyte management.  Right base pneumonia, patient is on cefdinir, azithromycin.chest xray yesterday shows improvement in right base pneumonia. More than 50% time spent in counseling, coordination of care. All the records are reviewed and case discussed with Care Management/Social Worker. Management plans discussed with the patient, family and they are in agreement.  CODE STATUS: DNR( spoke to pt and pts daughter and confirmed DNR.  DVT Prophylaxis: SCDs  TOTAL TIME TAKING CARE OF THIS PATIENT: 35 minutes.   POSSIBLE D/C IN 2 to 3 DAYS, DEPENDING ON CLINICAL CONDITION.  Epifanio Lesches M.D on 07/19/2019 at 10:26 AM  Between 7am to 6pm - Pager - [phone]  After 6pm go to [email or web address] - password [email or web address] [facility]  Office  [phone]  CC: Primary care physician; Kirk Ruths, MD  Note: This dictation was prepared with Dragon dictation along with smaller phrase technology. Any transcriptional errors that result from this process are unintentional.

## 2019-07-19 NOTE — Progress Notes (Signed)
Pharmacy Electrolyte Monitoring Consult:  Pharmacy consulted to assist in monitoring and replacing electrolytes in this 85 y.o. female admitted on 07/13/2019 with GI Bleed. Patient with history significant for CAD, atrial fibrillation, diabetes, and stage IV CKD.   Labs:  Sodium (mmol/L)  Date Value  07/19/2019 137  12/24/2012 138   Potassium (mmol/L)  Date Value  07/19/2019 3.4 (L)  12/24/2012 4.2   Magnesium (mg/dL)  Date Value  07/18/2019 2.1   Phosphorus (mg/dL)  Date Value  07/16/2019 3.5   Calcium (mg/dL)  Date Value  07/19/2019 8.3 (L)   Calcium, Total (mg/dL)  Date Value  12/24/2012 9.2   Albumin (g/dL)  Date Value  07/16/2019 3.4 (L)  12/22/2012 3.8   Corrected Ca: 8.78 mg/dL  Assessment/Plan:   Patient on furosemide 40mg  PO Daily started 9/13.    Will order additional potassium 59mEq PO x 2. watch for conservative replacement given renal fxn BMP with am labs.   Will replace for goal potassium ~ 4 and magnesium ~ 2 in setting of atrial fibrillation. Will replace orally in setting of diuresis.   Pharmacy will continue to monitor and adjust per consult.   Dallie Piles, PharmD 07/19/2019 7:36 AM

## 2019-07-19 NOTE — Progress Notes (Signed)
Physical Therapy Treatment Patient Details Name: Laura Kim MRN: [id number] DOB: [birth year] Today's Date: 07/19/2019    History of Present Illness 85 yo female with symptoms of GI bleed was admitted with INR 10.1, acute respiratory failure and weakness for 1-2 weeks prior to her arrival.  Had Hgb 5.7 at ED, was having pleural effusion and infiltrate, but noted stable R adrenal adenoma.  ARF, AKI.  PMHx:  a-fib, on coumadin, CAD, DM, AKI     PT Comments    Pt sitting in recliner on arrival, daughter in room.  Family had requested clarification on what the patient's needs will be when returning home (tomorrow?) and education, explanation and addressing questions directly provided t/o the session especially after seeing pt's ability to ambulation, negotiate steps, balance and generally current functional status vs PLOF.  Pt does not have AD at baseline, clearly needing some UE support t/o all standing acts today (pt was especially hesitant with stairs) and she struggled with some modest standing balance acts.  Overall pt was able to show the ability to safely return home with assist from husband and (at least initially) a daily check-in from other family along with Lucas.  Daughter present and further discussed ways to adjust environment and general safety considerations for in the home.  Pt walked    Follow Up Recommendations  Home health PT;Supervision - Intermittent     Equipment Recommendations  Rolling walker with 5" wheels(spoke with dtr about ramp, grab bars at toilet)    Recommendations for Other Services       Precautions / Restrictions Precautions Precautions: Fall Restrictions Weight Bearing Restrictions: No    Mobility  Bed Mobility               General bed mobility comments: in recliner on arrival, not tested  Transfers Overall transfer level: Modified independent Equipment used: Rolling walker (2 wheeled) Transfers: Sit to/from Stand Sit to Stand:  Supervision         General transfer comment: cues/edu to use UEs appropriately to push from arm rest  Ambulation/Gait Ambulation/Gait assistance: Min guard Gait Distance (Feet): 80 Feet, after seated rest break we did an additional 60 ft back to room Assistive device: Rolling walker (2 wheeled);None;1 person hand held assist       General Gait Details: Pt with consistent but somewhat guarded ambulation.  She was not overly reliant on the walker but when encouraged to attempt w/o AD she had considerable hesitancy and slowed gait.  Pt also c/o fatigue with the effort (especially after stair negotiation) despite O2 remaining in the mid 90s (room air) and HR rarely seeing to go higher 110. Pt with no LOBs or overt safety issues but again general guarding and need for at least light UE assist.    Stairs Stairs: Yes Stairs assistance: Min assist Stair Management: One rail Right;No rails Number of Stairs: 6 General stair comments: Attempted with rail and with single HHA, pt with some hesitancy and need for increased support through UEs but was able to negotiate safely.  She attempted reciprocal strategy ascending steps and struggled to lead with R LE, however with L leading step-to she executed safely and appropriately.     Wheelchair Mobility    Modified Rankin (Stroke Patients Only)       Balance Overall balance assessment: Needs assistance   Sitting balance-Leahy Scale: Normal     Standing balance support: Single extremity supported Standing balance-Leahy Scale: Fair Standing balance comment: At baseline pt apparently  has never needed AD/UE use, however today clear lack of confidence w/o UEs and generally needing guarding as she was hesitant and unsure of herself.  Pt struggled with modest balance challenges (HHA SLS, heel raises, tandem standing)                             Cognition Arousal/Alertness: Awake/alert Behavior During Therapy: WFL for tasks  assessed/performed Overall Cognitive Status: Within Functional Limits for tasks assessed                                        Exercises Other Exercises Other Exercises: static and modest dynamic balance activities.  all with faded HHA: EO/EC NBOS, SLS, tandem and heel raises.      General Comments        Pertinent Vitals/Pain Pain Assessment: No/denies pain    Home Living                      Prior Function            PT Goals (current goals can now be found in the care plan section) Progress towards PT goals: Progressing toward goals    Frequency    Min 2X/week      PT Plan Discharge plan needs to be updated    Co-evaluation              AM-PAC PT "6 Clicks" Mobility   Outcome Measure  Help needed turning from your back to your side while in a flat bed without using bedrails?: None Help needed moving from lying on your back to sitting on the side of a flat bed without using bedrails?: None Help needed moving to and from a bed to a chair (including a wheelchair)?: None Help needed standing up from a chair using your arms (e.g., wheelchair or bedside chair)?: None Help needed to walk in hospital room?: A Little Help needed climbing 3-5 steps with a railing? : A Little 6 Click Score: 22    End of Session Equipment Utilized During Treatment: Gait belt Activity Tolerance: Patient limited by fatigue Patient left: with chair alarm set;with call bell/phone within reach;with family/visitor present Nurse Communication: Mobility status PT Visit Diagnosis: Unsteadiness on feet (R26.81);Difficulty in walking, not elsewhere classified (R26.2)     Time: [id number] PT Time Calculation (min) (ACUTE ONLY): 39 min  Charges:  $Gait Training: 8-22 mins $Therapeutic Exercise: 8-22 mins $Therapeutic Activity: 8-22 mins                     Kreg Shropshire, DPT 07/19/2019, 4:49 PM

## 2019-07-20 LAB — BASIC METABOLIC PANEL
Anion gap: 10 (ref 5–15)
BUN: 77 mg/dL — ABNORMAL HIGH (ref 8–23)
CO2: 19 mmol/L — ABNORMAL LOW (ref 22–32)
Calcium: 8.1 mg/dL — ABNORMAL LOW (ref 8.9–10.3)
Chloride: 107 mmol/L (ref 98–111)
Creatinine, Ser: 4.46 mg/dL — ABNORMAL HIGH (ref 0.44–1.00)
GFR calc Af Amer: 10 mL/min — ABNORMAL LOW (ref >60)
GFR calc non Af Amer: 8 mL/min — ABNORMAL LOW (ref >60)
Glucose, Bld: 132 mg/dL — ABNORMAL HIGH (ref 70–99)
Potassium: 4.5 mmol/L (ref 3.5–5.1)
Sodium: 136 mmol/L (ref 135–145)

## 2019-07-20 LAB — PROTIME-INR
INR: 1.7 — ABNORMAL HIGH (ref 0.8–1.2)
Prothrombin Time: 19.4 seconds — ABNORMAL HIGH (ref 11.4–15.2)

## 2019-07-20 LAB — GLUCOSE, CAPILLARY
Glucose-Capillary: 121 mg/dL — ABNORMAL HIGH (ref 70–99)
Glucose-Capillary: 163 mg/dL — ABNORMAL HIGH (ref 70–99)

## 2019-07-20 LAB — MAGNESIUM: Magnesium: 2.1 mg/dL (ref 1.7–2.4)

## 2019-07-20 MED ORDER — WARFARIN SODIUM 3 MG PO TABS: 3.0000 mg | ORAL_TABLET | Freq: Once | ORAL | 0 refills | Status: AC

## 2019-07-20 MED ORDER — FUROSEMIDE 40 MG PO TABS: 40.0000 mg | ORAL_TABLET | Freq: Every day | ORAL | 0 refills | Status: AC

## 2019-07-20 MED ORDER — LEVOFLOXACIN 500 MG PO TABS: 500.0000 mg | ORAL_TABLET | ORAL | 0 refills | Status: AC

## 2019-07-20 MED ORDER — PANTOPRAZOLE SODIUM 40 MG PO TBEC: 40.0000 mg | DELAYED_RELEASE_TABLET | Freq: Two times a day (BID) | ORAL | 1 refills | Status: AC

## 2019-07-20 MED ORDER — WARFARIN SODIUM 3 MG PO TABS
3.0000 mg | ORAL_TABLET | Freq: Once | ORAL | Status: DC
  Filled 2019-07-20: qty 1

## 2019-07-20 NOTE — Progress Notes (Signed)
Anyia C Lando  A and O x 4. VSS. Pt tolerating diet well. No complaints of pain or nausea. IV removed intact, prescriptions given. Pt voiced understanding of discharge instructions with no further questions. Pt discharged home.   Allergies as of 07/20/2019      Reactions   Penicillins Itching, Rash, Other (See Comments)   Did it involve swelling of the face/tongue/throat, SOB, or low BP? NO Did it involve sudden or severe rash/hives, skin peeling, or any reaction on the inside of your mouth or nose? Yes Did you need to seek medical attention at a hospital or doctor's office? Unknown When did it last happen?>35 years If all above answers are "NO", may proceed with cephalosporin use.      Medication List    STOP taking these medications   aspirin 81 MG chewable tablet   benazepril 20 MG tablet Commonly known as: LOTENSIN   torsemide 10 MG tablet Commonly known as: DEMADEX     TAKE these medications   amLODipine 10 MG tablet Commonly known as: NORVASC Take 10 mg by mouth daily.   Fluticasone-Salmeterol 250-50 MCG/DOSE Aepb Commonly known as: ADVAIR Inhale 1 puff into the lungs 2 (two) times daily.   furosemide 40 MG tablet Commonly known as: LASIX Take 1 tablet (40 mg total) by mouth daily. Start taking on: July 21, 2019   HumaLOG KwikPen 100 UNIT/ML KwikPen Generic drug: insulin lispro Inject 6 Units into the skin 2 (two) times daily with a meal.   insulin glargine 100 unit/mL Sopn Commonly known as: LANTUS Inject 6 Units into the skin at bedtime.   levofloxacin 500 MG tablet Commonly known as: Levaquin Take 1 tablet (500 mg total) by mouth every other day for 5 doses.   pantoprazole 40 MG tablet Commonly known as: PROTONIX Take 1 tablet (40 mg total) by mouth 2 (two) times daily.   simvastatin 20 MG tablet Commonly known as: ZOCOR Take 20 mg by mouth daily.   warfarin 3 MG tablet Commonly known as: COUMADIN Take 1 tablet (3 mg total) by mouth one  time only at 6 PM. What changed:   medication strength  how much to take  when to take this            Durable Medical Equipment  (From admission, onward)         Start     Ordered   07/20/19 0929  For home use only DME Walker rolling  Once    Question:  Patient needs a walker to treat with the following condition  Answer:  Weakness   07/20/19 0928          Vitals:   07/20/19 0433 07/20/19 0730  BP: (!) 165/62   Pulse: 71   Resp: (!) 22   Temp: 98.6 F (37 C)   SpO2: 91% 91%    Francesco Sor

## 2019-07-20 NOTE — Progress Notes (Signed)
Pharmacy Electrolyte Monitoring Consult:  Pharmacy consulted to assist in monitoring and replacing electrolytes in this 85 y.o. female admitted on 07/13/2019 with GI Bleed. Patient with history significant for CAD, atrial fibrillation, diabetes, and stage IV CKD.   Labs:  Sodium (mmol/L)  Date Value  07/20/2019 136  12/24/2012 138   Potassium (mmol/L)  Date Value  07/20/2019 4.5  12/24/2012 4.2   Magnesium (mg/dL)  Date Value  07/20/2019 2.1   Phosphorus (mg/dL)  Date Value  07/16/2019 3.5   Calcium (mg/dL)  Date Value  07/20/2019 8.1 (L)   Calcium, Total (mg/dL)  Date Value  12/24/2012 9.2   Albumin (g/dL)  Date Value  07/16/2019 3.4 (L)  12/22/2012 3.8   Corrected Ca: 8.78 mg/dL  Assessment/Plan:   Patient on furosemide 40mg  PO Daily started 9/13  No additional electrolyte replacement warranted  Will replace for goal potassium ~ 4 and magnesium ~ 2 in setting of atrial fibrillation. Will replace orally in setting of diuresis.   Pharmacy will continue to monitor and adjust per consult.   Dallie Piles, PharmD 07/20/2019 7:11 AM

## 2019-07-20 NOTE — TOC Transition Note (Signed)
Transition of Care [facility]) - CM/SW Discharge Note   Patient Details  Name: Laura Kim MRN: [id number] Date of Birth: 1930-01-15  [facility] of Care [facility]) CM/SW Contact:  Beverly Sessions, RN Phone Number: 07/20/2019, 9:44 AM   Clinical Narrative:    Patient to be discharged today [name] with [place] notified of discharge  [name] with [place] to deliver RW prior to discharge  RNCM signing off    Final next level of care: [place] Barriers to Discharge: No Barriers Identified   Patient Goals and CMS Choice        Discharge Placement                       Discharge Plan and Services                DME Arranged: Walker rolling DME Agency: [employer] Date DME Agency Contacted: 07/20/19 Time DME Agency Contacted: [phone] Representative spoke with at DME Agency: [place]: RN, PT, OT, Nurse's Aide [place] Agency: [place] ([facility]) Date [place]: 07/20/19 Time [place]: [phone] Representative spoke with at [place]: [place] ([place]) Interventions     Readmission Risk Interventions No flowsheet data found.

## 2019-07-20 NOTE — Progress Notes (Signed)
IXL for warfarin dosing  Indication: atrial fibrillation; patient with coagulopathy this admission requiring FFP and Vitamin K.   Patient Measurements: Height: 5' (152.4 cm) Weight: 158 lb 4.6 oz (71.8 kg) IBW/kg (Calculated) : 45.5  Vital Signs: Temp: 98.6 F (37 C) (09/16 0433) Temp Source: Oral (09/16 0433) BP: 165/62 (09/16 0433) Pulse Rate: 71 (09/16 0433)  Labs: Recent Labs    07/18/19 0336 07/19/19 0606 07/19/19 1118 07/20/19 0358  HGB 7.3*  --  7.4*  --   HCT 23.6*  --  24.0*  --   PLT 227  --  196  --   LABPROT 18.9* 18.5*  --  19.4*  INR 1.6* 1.6*  --  1.7*  CREATININE 4.88* 4.58*  --  4.46*    Estimated Creatinine Clearance: 8 mL/min (A) (by C-G formula based on SCr of 4.46 mg/dL (H)).   Medical History: Past Medical History:  Diagnosis Date  . Coronary artery disease   . Diabetes mellitus without complication ([place])   . Seizures ([facility])     Medications:  Scheduled:  . docusate sodium  100 mg Oral BID  . furosemide  40 mg Oral Daily  . insulin aspart  0-9 Units Subcutaneous TID AC & HS  . ipratropium-albuterol  3 mL Nebulization Q6H WA  . pantoprazole  40 mg Oral BID  . Warfarin - Pharmacist Dosing Inpatient   Does not apply q1800   Infusions:  . sodium chloride Stopped (07/15/19 1653)    Assessment: Pharmacy consulted to restart warfarin for 85 yo female admitted with coagulopathy (INR 10.1) secondary to warfarin requiring FFP and vitamin K (10mg  IV 9/9). Patient takes warfarin 5mg  daily as an outpatient. Patient has history significant for atrial fibrillation and stage IV chronic kidney disease. Patient is receiving azithromycin until 9/16.  (per Consult order: please start Coumadin at a very low dose secondary to recent GI bleed)  Date   INR   Dose 9/12      2 mg 9/13   1.3   2 mg 9/14   1.6   2 mg 9/15   1.6   3 mg 9/16   1.7   3 mg      Goal of Therapy:  INR 2-3 Monitor platelets by  anticoagulation protocol: Yes   Plan:   INR subtherapeutic but with continuation of uptrend  Repeat warfarin 3 mg (50% of home dose) PO x 1.   Expect patient to have continued warfarin resistance post vitamin K administration  Will obtain INR and CBC with am labs.   Pharmacy will continue to monitor and adjust per consult.   Dallie Piles, PharmD 07/20/2019,7:13 AM

## 2019-07-20 NOTE — Discharge Summary (Signed)
Laura Kim, is a 85 y.o. female  DOB October 10, 1930  MRN [id number].  Admission date:  07/13/2019  Admitting Physician  Max Sane, MD  Discharge Date:  07/20/2019   Primary MD  Kirk Ruths, MD  Recommendations for primary care physician for things to follow:  Follow-up with PCP in 1 week Follow-up with Dr. Juleen China in 1 to 2 weeks regarding follow-up of kidney function    Admission Diagnosis  Shortness of breath [R06.02] ARF (acute renal failure) (HCC) [N17.9] Upper GI bleed [K92.2] AKI (acute kidney injury) ([place]) [N17.9] Elevated international normalized ratio (INR) [R79.1]   Discharge Diagnosis  Shortness of breath [R06.02] ARF (acute renal failure) (HCC) [N17.9] Upper GI bleed [K92.2] AKI (acute kidney injury) ([place]) [N17.9] Elevated international normalized ratio (INR) [R79.1]    Active Problems:   Anemia   Upper GI bleed   Melena   Duodenal arteriovenous malformation      Past Medical History:  Diagnosis Date  . Coronary artery disease   . Diabetes mellitus without complication ([place])   . Seizures ([place])     Past Surgical History:  Procedure Laterality Date  . ABDOMINAL HYSTERECTOMY    . CARDIAC SURGERY    . CHOLECYSTECTOMY    . COLONOSCOPY WITH ESOPHAGOGASTRODUODENOSCOPY (EGD)    . ESOPHAGOGASTRODUODENOSCOPY (EGD) WITH PROPOFOL N/A 07/15/2019   Procedure: ESOPHAGOGASTRODUODENOSCOPY (EGD) WITH PROPOFOL;  Surgeon: [name], MD;  Location: [facility] ENDOSCOPY;  Service: Endoscopy;  Laterality: N/A;       History of present illness and  Hospital Course:     Kindly see H&P for history of present illness and admission details, please review complete Labs, Consult reports and Test reports for all details in brief  HPI  from the history and physical done on the day of admission 85 year old  female patient with history of diabetes mellitus type 2, hypertension, hyperlipidemia, chronic atrial fibrillation on Coumadin comes in because of generalized weakness, going on for 1 to 2 weeks, patient found to have severe anemia with hemoglobin 5.7 on admission, INR was 10 on admission.   Hospital Course  #1.  Severe anemia on presentation likely due to GI bleed, patient received 2 minutes of blood transfusion, noted to have black stool for 1 to 2 weeks but did not tell any family members.  So patient is admitted to medical service, transfused 2 units of blood transfusion, started on IV Protonix.  2.  Severe coagulopathy INR was 10.1 on admission, received vitamin K, FFP, Coumadin was stopped, INR is down to 1.3 at that time patient had EGD.  Patient Coumadin restarted very slowly after discussing with gastroenterologist and after endoscopy, INR today is 1.7.  Patient to have home health nursing to check INR tomorrow and fax results to PCP to adjust the dose of Coumadin.  Patient is monitored closely while on Coumadin in the hospital, did not have any further GI bleed, hemoglobin stayed stable 7.4.    #3. 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  Hemoglobin stable around 7.3 after blood transfusion.  4.  Acute on chronic kidney disease stage IV, seen by nephrology, baseline creatinine is 2.6, GFR 12, patient acute renal failure secondary to acute blood loss and GI bleed, seen by nephrology, ultrasound of kidney showed chronic kidney disease.  Patient to follow-up with nephrology as an outpatient.  Creatinine trended down, it is 4.46 today.  #5./Essential hypertension, controlled, hold ACE inhibitor's at discharge secondary to renal failure which is worse  this hospitalization, patient is to follow-up neurology as an outpatient.   6.  Diabetes mellitus type 2, patient is on Lantus 6 units at bedtime, Humalog KwikPen 6 units twice daily with meals.     #7.  Acute respiratory failure secondary to acute on chronic diastolic heart failure, pneumonia: Patient was on oxygen at one point but weaned off oxygen and on room air right now and saturation 95%.     Patient seen with nephrologist, started on IV Lasix and transitioned to oral Lasix, regarding pneumonia patient received IV antibiotics, discharged home with oral Levaquin every 48 hours for 5 doses.    Discharge Condition: Stable   Follow UP  Follow-up Information    Kirk Ruths, MD. Schedule an appointment as soon as possible for a visit in 1 week(s).   Specialty: Internal Medicine Contact information: [address] [place] [address] [phone]        Lavonia Dana, MD. Schedule an appointment as soon as possible for a visit in 1 week(s).   Specialty: Internal Medicine Why: follow up of renal insuficiency  Contact information: [place] [address] [phone]             Discharge Instructions  and  Discharge Medications  Generalized weakness, discharged home with home health physical therapy, nurse    Allergies as of 07/20/2019      Reactions   Penicillins Itching, Rash, Other (See Comments)   Did it involve swelling of the face/tongue/throat, SOB, or low BP? NO Did it involve sudden or severe rash/hives, skin peeling, or any reaction on the inside of your mouth or nose? Yes Did you need to seek medical attention at a hospital or doctor's office? Unknown When did it last happen?>35 years If all above answers are "NO", may proceed with cephalosporin use.      Medication List    STOP taking these medications   aspirin 81 MG chewable tablet   benazepril 20 MG tablet Commonly known as: LOTENSIN   torsemide 10 MG tablet Commonly known as: DEMADEX     TAKE these medications   amLODipine 10 MG tablet Commonly known as: NORVASC Take 10 mg by mouth daily.    Fluticasone-Salmeterol 250-50 MCG/DOSE Aepb Commonly known as: ADVAIR Inhale 1 puff into the lungs 2 (two) times daily.   furosemide 40 MG tablet Commonly known as: LASIX Take 1 tablet (40 mg total) by mouth daily. Start taking on: July 21, 2019   HumaLOG KwikPen 100 UNIT/ML KwikPen Generic drug: insulin lispro Inject 6 Units into the skin 2 (two) times daily with a meal.   insulin glargine 100 unit/mL Sopn Commonly known as: LANTUS Inject 6 Units into the skin at bedtime.   levofloxacin 500 MG tablet Commonly known as: Levaquin Take 1 tablet (500 mg total) by mouth every other day for 5 doses.   pantoprazole 40 MG tablet Commonly known as: PROTONIX Take 1 tablet (40 mg total) by mouth 2 (two) times daily.   simvastatin 20 MG tablet Commonly known as: ZOCOR Take 20 mg by mouth daily.   warfarin 3 MG tablet Commonly known as: COUMADIN Take 1 tablet (3 mg total) by mouth one time only at 6 PM. What changed:   medication strength  how much to take  when to take this            Durable Medical Equipment  (From admission, onward)  Start     Ordered   07/20/19 0929  For home use only DME Walker rolling  Once    Question:  Patient needs a walker to treat with the following condition  Answer:  Weakness   07/20/19 0928            Diet and Activity recommendation: See Discharge Instructions above   Consults obtained -nephrology, gastroenterology   Major procedures and Radiology Reports - PLEASE review detailed and final reports for all details, in brief -      US Renal  Result Date: 07/15/2019 CLINICAL DATA:  Acute renal failure EXAM: RENAL / URINARY TRACT ULTRASOUND COMPLETE COMPARISON:  MRI from 08/25/2013. FINDINGS: Right Kidney: Renal measurements: 9.6 x 4.1 x 4.4 cm. = volume: 91 mL. Multiple cysts are noted throughout the right kidney. The largest of these measures 1.9 cm. Left Kidney: Renal measurements: 8.5 x 2.7 x 3.1 cm. =  volume: 37 mL. Multiple cysts are noted within the left kidney. The largest of these measures 2.4 cm. Bladder: Appears normal for degree of bladder distention. Note is made of bilateral pleural effusions. Stable right adrenal adenoma is noted. IMPRESSION: Bilateral renal atrophy stable from the prior exams. Bilateral renal cysts as described. Bilateral pleural effusions. Stable right adrenal adenoma. Electronically Signed   By: Inez Catalina M.D.   On: 07/15/2019 12:54   Dg Chest Port 1 View  Result Date: 07/17/2019 CLINICAL DATA:  Pneumonia. EXAM: PORTABLE CHEST 1 VIEW COMPARISON:  07/15/2019 and prior chest radiographs FINDINGS: Cardiomegaly, pulmonary vascular congestion and CABG changes again noted. RIGHT basilar opacity has slightly decreased. No pneumothorax noted. LEFT basilar opacity/atelectasis with possible small LEFT pleural effusion noted. IMPRESSION: Slightly decreased RIGHT basilar opacity/pneumonia. Otherwise unchanged appearance of the chest with cardiomegaly and pulmonary vascular congestion. Electronically Signed   By: Margarette Canada M.D.   On: 07/17/2019 14:45   Dg Chest Port 1 View  Result Date: 07/15/2019 CLINICAL DATA:  Acute respiratory failure EXAM: PORTABLE CHEST 1 VIEW COMPARISON:  07/13/2019 FINDINGS: Cardiac shadow is enlarged. Postsurgical changes are again seen and stable. Aortic calcifications are again noted. Lungs are well aerated bilaterally. Increased patchy infiltrate is noted in the right lung base medially. Chronic scarring in the left base is noted. IMPRESSION: New right basilar infiltrate Electronically Signed   By: Inez Catalina M.D.   On: 07/15/2019 08:38   Dg Chest Portable 1 View  Result Date: 07/13/2019 CLINICAL DATA:  Shortness of breath EXAM: PORTABLE CHEST 1 VIEW COMPARISON:  Areae nineteenth 2014 FINDINGS: There is cardiomegaly with overlying median sternotomy wires. There is pulmonary vascular congestion. There is a trace left pleural effusion. Aortic valve  calcifications and mitral valve calcifications are noted. No acute osseous abnormality. IMPRESSION: Pulmonary vascular congestion and probable trace left pleural effusion. Electronically Signed   By: Prudencio Pair M.D.   On: 07/13/2019 22:44    Micro Results    Recent Results (from the past 240 hour(s))  SARS CORONAVIRUS 2 (TAT 6-24 HRS) Nasopharyngeal Nasopharyngeal Swab     Status: None   Collection Time: 07/13/19 10:42 PM   Specimen: Nasopharyngeal Swab  Result Value Ref Range Status   SARS Coronavirus 2 NEGATIVE NEGATIVE Final    Comment: (NOTE) SARS-CoV-2 target nucleic acids are NOT DETECTED. The SARS-CoV-2 RNA is generally detectable in upper and lower respiratory specimens during the acute phase of infection. Negative results do not preclude SARS-CoV-2 infection, do not rule out co-infections with other pathogens, and should not be used as  the sole basis for treatment or other patient management decisions. Negative results must be combined with clinical observations, patient history, and epidemiological information. The expected result is Negative. Fact Sheet for Patients: SugarRoll.be Fact Sheet for Healthcare Providers: https://www.woods-mathews.com/ This test is not yet approved or cleared by the [place] FDA and  has been authorized for detection and/or diagnosis of SARS-CoV-2 by FDA under an Emergency Use Authorization (EUA). This EUA will remain  in effect (meaning this test can be used) for the duration of the COVID-19 declaration under Section 56 4(b)(1) of the Act, 21 U.S.C. section 360bbb-3(b)(1), unless the authorization is terminated or revoked sooner. Performed at [facility], [address], [place], [place] [address]   MRSA PCR Screening     Status: None   Collection Time: 07/14/19  2:28 PM  Result Value Ref Range Status   MRSA by PCR NEGATIVE NEGATIVE Final    Comment:        The GeneXpert MRSA Assay  (FDA approved for NASAL specimens only), is one component of a comprehensive MRSA colonization surveillance program. It is not intended to diagnose MRSA infection nor to guide or monitor treatment for MRSA infections. Performed at [facility], [address], [place], [place] [address]        Today   Subjective:   Laquesha Hamaker today has no headache,no chest abdominal pain,no new weakness tingling or numbness, feels much better wants to go home today.   Objective:   Blood pressure (!) 165/62, pulse 71, temperature 98.6 F (37 C), temperature source Oral, resp. rate (!) 22, height 5' (1.524 m), weight 71.8 kg, SpO2 91 %.   Intake/Output Summary (Last 24 hours) at 07/20/2019 0928 Last data filed at 07/20/2019 0456 Gross per 24 hour  Intake 360 ml  Output 250 ml  Net 110 ml    Exam Awake Alert, Oriented x 3, left face ecchymosis that is resolving.  No new F.N deficits, Normal affect [place].AT,PERRAL Supple Neck,No JVD, No cervical lymphadenopathy appriciated.  Symmetrical Chest wall movement, Good air movement bilaterally, CTAB RRR,No Gallops,Rubs or new Murmurs, No Parasternal Heave +ve B.Sounds, Abd Soft, Non tender, No organomegaly appriciated, No rebound -guarding or rigidity. No Cyanosis, Clubbing or edema, No new Rash or bruise  Data Review   CBC w Diff:  Lab Results  Component Value Date   WBC 8.3 07/19/2019   HGB 7.4 (L) 07/19/2019   HGB 11.3 (L) 12/24/2012   HCT 24.0 (L) 07/19/2019   HCT 33.4 (L) 12/24/2012   PLT 196 07/19/2019   PLT 165 12/24/2012   LYMPHOPCT 9 07/15/2019   LYMPHOPCT 20.7 12/24/2012   MONOPCT 10 07/15/2019   MONOPCT 12.6 12/24/2012   EOSPCT 0 07/15/2019   EOSPCT 4.3 12/24/2012   BASOPCT 0 07/15/2019   BASOPCT 1.0 12/24/2012    CMP:  Lab Results  Component Value Date   NA 136 07/20/2019   NA 138 12/24/2012   K 4.5 07/20/2019   K 4.2 12/24/2012   CL 107 07/20/2019   CL 105 12/24/2012   CO2 19 (L) 07/20/2019    CO2 23 12/24/2012   BUN 77 (H) 07/20/2019   BUN 35 (H) 12/24/2012   CREATININE 4.46 (H) 07/20/2019   CREATININE 2.31 (H) 12/24/2012   PROT 7.9 12/22/2012   ALBUMIN 3.4 (L) 07/16/2019   ALBUMIN 3.8 12/22/2012   BILITOT 0.5 12/22/2012   ALKPHOS 69 12/22/2012   AST 17 12/22/2012   ALT 15 12/22/2012  .  Total Time in preparing paper work, data evaluation and todays exam - 35 minutes  Epifanio Lesches M.D on 07/20/2019 at 9:28 AM    Note: This dictation was prepared with Dragon dictation along with smaller phrase technology. Any transcriptional errors that result from this process are unintentional.

## 2019-10-31 ENCOUNTER — Inpatient Hospital Stay
Admission: EM | Admit: 2019-10-31 | Discharge: 2019-11-04 | DRG: 871 | Disposition: E | Payer: Medicare Other | Attending: Internal Medicine | Admitting: Internal Medicine

## 2019-10-31 ENCOUNTER — Emergency Department: Payer: Medicare Other

## 2019-10-31 ENCOUNTER — Other Ambulatory Visit: Payer: Self-pay

## 2019-10-31 DIAGNOSIS — G92 Toxic encephalopathy: Secondary | ICD-10-CM | POA: Diagnosis present

## 2019-10-31 DIAGNOSIS — A419 Sepsis, unspecified organism: Secondary | ICD-10-CM | POA: Diagnosis present

## 2019-10-31 DIAGNOSIS — Z66 Do not resuscitate: Secondary | ICD-10-CM | POA: Diagnosis present

## 2019-10-31 DIAGNOSIS — J9692 Respiratory failure, unspecified with hypercapnia: Secondary | ICD-10-CM | POA: Diagnosis not present

## 2019-10-31 DIAGNOSIS — Z9049 Acquired absence of other specified parts of digestive tract: Secondary | ICD-10-CM

## 2019-10-31 DIAGNOSIS — R0902 Hypoxemia: Secondary | ICD-10-CM | POA: Diagnosis present

## 2019-10-31 DIAGNOSIS — R6520 Severe sepsis without septic shock: Secondary | ICD-10-CM | POA: Diagnosis present

## 2019-10-31 DIAGNOSIS — I130 Hypertensive heart and chronic kidney disease with heart failure and stage 1 through stage 4 chronic kidney disease, or unspecified chronic kidney disease: Secondary | ICD-10-CM | POA: Diagnosis present

## 2019-10-31 DIAGNOSIS — I2510 Atherosclerotic heart disease of native coronary artery without angina pectoris: Secondary | ICD-10-CM | POA: Diagnosis present

## 2019-10-31 DIAGNOSIS — E785 Hyperlipidemia, unspecified: Secondary | ICD-10-CM | POA: Diagnosis present

## 2019-10-31 DIAGNOSIS — I4821 Permanent atrial fibrillation: Secondary | ICD-10-CM | POA: Diagnosis present

## 2019-10-31 DIAGNOSIS — N390 Urinary tract infection, site not specified: Secondary | ICD-10-CM | POA: Diagnosis present

## 2019-10-31 DIAGNOSIS — I248 Other forms of acute ischemic heart disease: Secondary | ICD-10-CM | POA: Diagnosis present

## 2019-10-31 DIAGNOSIS — Z8719 Personal history of other diseases of the digestive system: Secondary | ICD-10-CM

## 2019-10-31 DIAGNOSIS — N184 Chronic kidney disease, stage 4 (severe): Secondary | ICD-10-CM | POA: Diagnosis present

## 2019-10-31 DIAGNOSIS — R0609 Other forms of dyspnea: Secondary | ICD-10-CM

## 2019-10-31 DIAGNOSIS — Z90710 Acquired absence of both cervix and uterus: Secondary | ICD-10-CM

## 2019-10-31 DIAGNOSIS — Z880 Allergy status to penicillin: Secondary | ICD-10-CM

## 2019-10-31 DIAGNOSIS — Z794 Long term (current) use of insulin: Secondary | ICD-10-CM

## 2019-10-31 DIAGNOSIS — E871 Hypo-osmolality and hyponatremia: Secondary | ICD-10-CM | POA: Diagnosis present

## 2019-10-31 DIAGNOSIS — R791 Abnormal coagulation profile: Secondary | ICD-10-CM | POA: Diagnosis present

## 2019-10-31 DIAGNOSIS — Z515 Encounter for palliative care: Secondary | ICD-10-CM | POA: Diagnosis not present

## 2019-10-31 DIAGNOSIS — Z7951 Long term (current) use of inhaled steroids: Secondary | ICD-10-CM

## 2019-10-31 DIAGNOSIS — Z20828 Contact with and (suspected) exposure to other viral communicable diseases: Secondary | ICD-10-CM | POA: Diagnosis present

## 2019-10-31 DIAGNOSIS — Z79899 Other long term (current) drug therapy: Secondary | ICD-10-CM

## 2019-10-31 DIAGNOSIS — N179 Acute kidney failure, unspecified: Secondary | ICD-10-CM | POA: Diagnosis present

## 2019-10-31 DIAGNOSIS — J9819 Other pulmonary collapse: Secondary | ICD-10-CM | POA: Diagnosis present

## 2019-10-31 DIAGNOSIS — J9602 Acute respiratory failure with hypercapnia: Secondary | ICD-10-CM | POA: Diagnosis present

## 2019-10-31 DIAGNOSIS — E875 Hyperkalemia: Secondary | ICD-10-CM | POA: Diagnosis present

## 2019-10-31 DIAGNOSIS — E1122 Type 2 diabetes mellitus with diabetic chronic kidney disease: Secondary | ICD-10-CM | POA: Diagnosis present

## 2019-10-31 DIAGNOSIS — I509 Heart failure, unspecified: Secondary | ICD-10-CM

## 2019-10-31 DIAGNOSIS — Z7901 Long term (current) use of anticoagulants: Secondary | ICD-10-CM

## 2019-10-31 DIAGNOSIS — R531 Weakness: Secondary | ICD-10-CM | POA: Diagnosis present

## 2019-10-31 DIAGNOSIS — J9621 Acute and chronic respiratory failure with hypoxia: Secondary | ICD-10-CM

## 2019-10-31 DIAGNOSIS — N189 Chronic kidney disease, unspecified: Secondary | ICD-10-CM

## 2019-10-31 LAB — CBC WITH DIFFERENTIAL/PLATELET
Abs Immature Granulocytes: 0.05 10*3/uL (ref 0.00–0.07)
Basophils Absolute: 0 10*3/uL (ref 0.0–0.1)
Basophils Relative: 0 %
Eosinophils Absolute: 0 10*3/uL (ref 0.0–0.5)
Eosinophils Relative: 0 %
HCT: 33.2 % — ABNORMAL LOW (ref 36.0–46.0)
Hemoglobin: 9.2 g/dL — ABNORMAL LOW (ref 12.0–15.0)
Immature Granulocytes: 1 %
Lymphocytes Relative: 6 %
Lymphs Abs: 0.6 10*3/uL — ABNORMAL LOW (ref 0.7–4.0)
MCH: 19.2 pg — ABNORMAL LOW (ref 26.0–34.0)
MCHC: 27.7 g/dL — ABNORMAL LOW (ref 30.0–36.0)
MCV: 69.2 fL — ABNORMAL LOW (ref 80.0–100.0)
Monocytes Absolute: 0.8 10*3/uL (ref 0.1–1.0)
Monocytes Relative: 9 %
Neutro Abs: 8 10*3/uL — ABNORMAL HIGH (ref 1.7–7.7)
Neutrophils Relative %: 84 %
Platelets: 190 10*3/uL (ref 150–400)
RBC: 4.8 MIL/uL (ref 3.87–5.11)
RDW: 18.9 % — ABNORMAL HIGH (ref 11.5–15.5)
Smear Review: NORMAL
WBC: 9.5 10*3/uL (ref 4.0–10.5)
nRBC: 1.3 % — ABNORMAL HIGH (ref 0.0–0.2)

## 2019-10-31 LAB — BASIC METABOLIC PANEL
Anion gap: 15 (ref 5–15)
Anion gap: 11 (ref 5–15)
BUN: 101 mg/dL — ABNORMAL HIGH (ref 8–23)
BUN: 103 mg/dL — ABNORMAL HIGH (ref 8–23)
CO2: 19 mmol/L — ABNORMAL LOW (ref 22–32)
CO2: 19 mmol/L — ABNORMAL LOW (ref 22–32)
Calcium: 8.1 mg/dL — ABNORMAL LOW (ref 8.9–10.3)
Calcium: 11.6 mg/dL — ABNORMAL HIGH (ref 8.9–10.3)
Chloride: 91 mmol/L — ABNORMAL LOW (ref 98–111)
Chloride: 97 mmol/L — ABNORMAL LOW (ref 98–111)
Creatinine, Ser: 4.46 mg/dL — ABNORMAL HIGH (ref 0.44–1.00)
Creatinine, Ser: 4.26 mg/dL — ABNORMAL HIGH (ref 0.44–1.00)
GFR calc Af Amer: 10 mL/min — ABNORMAL LOW (ref >60)
GFR calc Af Amer: 10 mL/min — ABNORMAL LOW (ref >60)
GFR calc non Af Amer: 8 mL/min — ABNORMAL LOW (ref >60)
GFR calc non Af Amer: 9 mL/min — ABNORMAL LOW (ref >60)
Glucose, Bld: 124 mg/dL — ABNORMAL HIGH (ref 70–99)
Glucose, Bld: 37 mg/dL — CL (ref 70–99)
Potassium: 6.6 mmol/L (ref 3.5–5.1)
Potassium: 6.1 mmol/L — ABNORMAL HIGH (ref 3.5–5.1)
Sodium: 125 mmol/L — ABNORMAL LOW (ref 135–145)
Sodium: 127 mmol/L — ABNORMAL LOW (ref 135–145)

## 2019-10-31 LAB — URINALYSIS, COMPLETE (UACMP) WITH MICROSCOPIC
Bacteria, UA: NONE SEEN
Bilirubin Urine: NEGATIVE
Glucose, UA: NEGATIVE mg/dL
Ketones, ur: NEGATIVE mg/dL
Nitrite: NEGATIVE
Protein, ur: 100 mg/dL — AB
RBC / HPF: >50 RBC/hpf — ABNORMAL HIGH (ref 0–5)
Specific Gravity, Urine: 1.011 (ref 1.005–1.030)
WBC, UA: >50 WBC/hpf — ABNORMAL HIGH (ref 0–5)
pH: 5 (ref 5.0–8.0)

## 2019-10-31 LAB — POC SARS CORONAVIRUS 2 AG: SARS Coronavirus 2 Ag: NEGATIVE

## 2019-10-31 LAB — PROTIME-INR
INR: 4.9 (ref 0.8–1.2)
Prothrombin Time: 45.7 seconds — ABNORMAL HIGH (ref 11.4–15.2)

## 2019-10-31 LAB — TROPONIN I (HIGH SENSITIVITY): Troponin I (High Sensitivity): 138 ng/L (ref <18)

## 2019-10-31 LAB — BLOOD GAS, VENOUS
Acid-base deficit: 11.4 mmol/L — ABNORMAL HIGH (ref 0.0–2.0)
Bicarbonate: 18.9 mmol/L — ABNORMAL LOW (ref 20.0–28.0)
O2 Saturation: 72.3 %
Patient temperature: 37
pCO2, Ven: 61 mmHg — ABNORMAL HIGH (ref 44.0–60.0)
pH, Ven: 7.1 — CL (ref 7.250–7.430)
pO2, Ven: 53 mmHg — ABNORMAL HIGH (ref 32.0–45.0)

## 2019-10-31 LAB — LACTIC ACID, PLASMA
Lactic Acid, Venous: 3.4 mmol/L (ref 0.5–1.9)
Lactic Acid, Venous: 2.1 mmol/L (ref 0.5–1.9)

## 2019-10-31 LAB — RESPIRATORY PANEL BY RT PCR (FLU A&B, COVID)
Influenza A by PCR: NEGATIVE
Influenza B by PCR: NEGATIVE
SARS Coronavirus 2 by RT PCR: NEGATIVE

## 2019-10-31 LAB — GLUCOSE, CAPILLARY
Glucose-Capillary: 119 mg/dL — ABNORMAL HIGH (ref 70–99)
Glucose-Capillary: 113 mg/dL — ABNORMAL HIGH (ref 70–99)
Glucose-Capillary: 118 mg/dL — ABNORMAL HIGH (ref 70–99)
Glucose-Capillary: 34 mg/dL — CL (ref 70–99)

## 2019-10-31 LAB — BRAIN NATRIURETIC PEPTIDE: B Natriuretic Peptide: 3042 pg/mL — ABNORMAL HIGH (ref 0.0–100.0)

## 2019-10-31 MED ORDER — FUROSEMIDE 10 MG/ML IJ SOLN
80.0000 mg | Freq: Once | INTRAMUSCULAR | Status: AC
  Administered 2019-10-31: 80 mg via INTRAVENOUS
  Filled 2019-10-31: qty 8

## 2019-10-31 MED ORDER — LORAZEPAM 2 MG/ML IJ SOLN
INTRAMUSCULAR | Status: AC
  Filled 2019-10-31: qty 1

## 2019-10-31 MED ORDER — DEXTROSE 50 % IV SOLN
INTRAVENOUS | Status: AC
  Administered 2019-10-31: 50 mL
  Filled 2019-10-31: qty 50

## 2019-10-31 MED ORDER — SODIUM BICARBONATE 8.4 % IV SOLN
50.0000 meq | Freq: Once | INTRAVENOUS | Status: AC
  Administered 2019-10-31: 50 meq via INTRAVENOUS
  Filled 2019-10-31: qty 50

## 2019-10-31 MED ORDER — LORAZEPAM 2 MG/ML IJ SOLN
1.0000 mg | INTRAMUSCULAR | Status: DC | PRN
  Administered 2019-10-31: 1 mg via INTRAVENOUS

## 2019-10-31 MED ORDER — VANCOMYCIN HCL IN DEXTROSE 750-5 MG/150ML-% IV SOLN
750.0000 mg | Freq: Once | INTRAVENOUS | Status: AC
  Administered 2019-10-31: 750 mg via INTRAVENOUS
  Filled 2019-10-31: qty 150

## 2019-10-31 MED ORDER — INSULIN ASPART 100 UNIT/ML ~~LOC~~ SOLN: 0.0000 [IU] | Freq: Three times a day (TID) | SUBCUTANEOUS | Status: DC

## 2019-10-31 MED ORDER — SODIUM CHLORIDE 0.9 % IV SOLN
2.0000 g | Freq: Once | INTRAVENOUS | Status: AC
  Administered 2019-10-31: 2 g via INTRAVENOUS
  Filled 2019-10-31: qty 20

## 2019-10-31 MED ORDER — SODIUM CHLORIDE 0.9 % IV SOLN
2.0000 g | Freq: Once | INTRAVENOUS | Status: AC
  Administered 2019-10-31: 2 g via INTRAVENOUS
  Filled 2019-10-31: qty 2

## 2019-10-31 MED ORDER — MORPHINE SULFATE (PF) 2 MG/ML IV SOLN: 2.0000 mg | INTRAVENOUS | Status: DC | PRN

## 2019-10-31 MED ORDER — INSULIN ASPART 100 UNIT/ML ~~LOC~~ SOLN
10.0000 [IU] | Freq: Once | SUBCUTANEOUS | Status: AC
  Administered 2019-10-31: 10 [IU] via INTRAVENOUS
  Filled 2019-10-31: qty 1

## 2019-10-31 MED ORDER — VANCOMYCIN HCL IN DEXTROSE 1-5 GM/200ML-% IV SOLN
1000.0000 mg | Freq: Once | INTRAVENOUS | Status: AC
  Administered 2019-10-31: 1000 mg via INTRAVENOUS
  Filled 2019-10-31: qty 200

## 2019-10-31 MED ORDER — DEXTROSE 5 % IV SOLN: 500.0000 mg | INTRAVENOUS | Status: DC

## 2019-10-31 MED ORDER — DEXTROSE 50 % IV SOLN
25.0000 g | Freq: Once | INTRAVENOUS | Status: AC
  Administered 2019-10-31: 25 g via INTRAVENOUS
  Filled 2019-10-31: qty 50

## 2019-10-31 MED ORDER — SODIUM CHLORIDE 0.9 % IV SOLN: 1.0000 g | INTRAVENOUS | Status: DC

## 2019-10-31 MED ORDER — VANCOMYCIN VARIABLE DOSE PER UNSTABLE RENAL FUNCTION (PHARMACIST DOSING): Status: DC

## 2019-10-31 MED ORDER — PANTOPRAZOLE SODIUM 40 MG PO TBEC: 40.0000 mg | DELAYED_RELEASE_TABLET | Freq: Two times a day (BID) | ORAL | Status: DC

## 2019-10-31 MED ORDER — SODIUM CHLORIDE 0.9 % IV SOLN
2.0000 g | Freq: Once | INTRAVENOUS | Status: DC
  Filled 2019-10-31: qty 20

## 2019-11-01 DIAGNOSIS — Z66 Do not resuscitate: Secondary | ICD-10-CM | POA: Insufficient documentation

## 2019-11-01 DIAGNOSIS — R0600 Dyspnea, unspecified: Secondary | ICD-10-CM | POA: Insufficient documentation

## 2019-11-01 DIAGNOSIS — J9621 Acute and chronic respiratory failure with hypoxia: Secondary | ICD-10-CM | POA: Insufficient documentation

## 2019-11-01 DIAGNOSIS — Z515 Encounter for palliative care: Secondary | ICD-10-CM | POA: Insufficient documentation

## 2019-11-01 LAB — URINE CULTURE
Culture: NO GROWTH
Special Requests: NORMAL

## 2019-11-02 LAB — CULTURE, BLOOD (ROUTINE X 2): Special Requests: ADEQUATE

## 2019-11-04 NOTE — Consult Note (Signed)
Consultation Note Date: 2019-11-03   Patient Name: Laura Kim  DOB: 1930-06-27  MRN: [id number]  Age / Sex: 86 y.o., female  PCP: Kirk Ruths, MD Referring Physician: Sidney Ace, MD  Reason for Consultation: Establishing goals of care and Psychosocial/spiritual support  HPI/Patient Profile: 86 y.o. female  admitted on 11/03/2019 with past   medical history significant of coronary artery disease, diabetes,, hypertension, permanent atrial fibrillation, chronic anticoagulation, chronic kidney disease stage IV not on hemodialysis presents emergency department for worsening weakness times several days.    Laboratory investigations significant for multiple derangements including hyponatremia to 125, hyperkalemia 6.6, uremic with BUN of 101, creatine 4.26elevated B natruretic peptide to 3000, elevated high-sensitivity troponin to 138, elevated lactic acid 3.4, elevated INR to 4.9.   ED Course: On arrival patient is requiring increased  oxygen needs.   She was started on BiPAP therapy.    Patient was pancultured and broad-spectrum antibiotics were initiated.    Currently patient remains tachypneic on BiPAP.  She appears uncomfortable she is agitated.  Palliative medicine consult to establish goals of care and offer emotional support to patient and family     Family face treatment option decisions, advanced directive decisions and anticipatory care needs.     Clinical Assessment and Goals of Care:  This NP Wadie Lessen reviewed medical records, received report from team, assessed the patient and then spoke to daughter/ Haig Prophet by telephone to discuss diagnosis, prognosis, GOC, EOL wishes disposition and options.  Concept of Palliative Care was discussed  A  discussion was had today regarding advanced directives.   The difference between a aggressive medical intervention  path  and a palliative comfort care path for this patient at this time was had.  Values and goals of care important to patient and family were attempted to be elicited.   Family were very clear that she would not want aggressive measures and the focus of care should be comfort.   They are on the way to the hospital for EOL visitation before BiPap is removed.  Family were able to arrive at the bedside.  Patient's husband and children were able to visit.  This shift was made to full comfort and allow a natural death   Natural trajectory and expectations at EOL were discussed.  Questions and concerns addressed.   Family encouraged to call with questions or concerns.    PMT will continue to support holistically.   Encouraged family to consider emotional support/grief counseling.  This patient's grandson died only a week ago in a tragic car accident.. The family members are dealing with compounded grief     NEXT OF KIN/ husband and children together make medical decsions    SUMMARY OF RECOMMENDATIONS    Once family has come to visit shift to full comfort approach focusing on comfort and dignity. -DNR/DNI    /No Bipap -no artifical feeding or hydration now or in the future -symptom management for dyspnea nd pain - allow a  natural death  Code Status/Advance Care Planning:  DNR   Symptom Management:  Once family arrives and focus of care shifts to comfort  Dyspnea/Pain: Morphine IV  Agitation: Ativan IV  Terminal secretions: Robinol IV  Palliative Prophylaxis:   Aspiration, Delirium Protocol, Frequent Pain Assessment and Oral Care  Additional Recommendations (Limitations, Scope, Preferences):  Full Comfort Care  Psycho-social/Spiritual:   Desire for further Chaplaincy support:yes  Additional Recommendations: Grief/Bereavement Support  Prognosis:   Hours - Days  Discharge Planning: Anticipated Hospital Death      Primary Diagnoses: Present on Admission: .  Sepsis ([place])   I have reviewed the medical record, interviewed the patient and family, and examined the patient. The following aspects are pertinent.  Past Medical History:  Diagnosis Date  . Coronary artery disease   . Diabetes mellitus without complication ([place])   . Seizures ([place])    Social History   Socioeconomic History  . Marital status: Married    Spouse name: Not on file  . Number of children: Not on file  . Years of education: Not on file  . Highest education level: Not on file  Occupational History  . Not on file  Tobacco Use  . Smoking status: Never Smoker  . Smokeless tobacco: Never Used  Substance and Sexual Activity  . Alcohol use: Never  . Drug use: Never  . Sexual activity: Not on file  Other Topics Concern  . Not on file  Social History Narrative  . Not on file   Social Determinants of Health   Financial Resource Strain:   . Difficulty of Paying Living Expenses: Not on file  Food Insecurity:   . Worried About Charity fundraiser in the Last Year: Not on file  . Ran Out of Food in the Last Year: Not on file  Transportation Needs:   . Lack of Transportation (Medical): Not on file  . Lack of Transportation (Non-Medical): Not on file  Physical Activity:   . Days of Exercise per Week: Not on file  . Minutes of Exercise per Session: Not on file  Stress:   . Feeling of Stress : Not on file  Social Connections:   . Frequency of Communication with Friends and Family: Not on file  . Frequency of Social Gatherings with Friends and Family: Not on file  . Attends Religious Services: Not on file  . Active Member of Clubs or Organizations: Not on file  . Attends Archivist Meetings: Not on file  . Marital Status: Not on file   No family history on file. Scheduled Meds: . insulin aspart  0-9 Units Subcutaneous TID WC  . pantoprazole  40 mg Oral BID  . vancomycin variable dose per unstable renal function (pharmacist dosing)   Does not apply See  admin instructions   Continuous Infusions: . [START ON 11/01/2019] ceFEPime (MAXIPIME) IV     PRN Meds:. Medications Prior to Admission:  Prior to Admission medications   Medication Sig Start Date End Date Taking? Authorizing Provider  albuterol (VENTOLIN HFA) 108 (90 Base) MCG/ACT inhaler Inhale 2 puffs into the lungs 4 (four) times daily as needed for shortness of breath or wheezing. 07/13/19  Yes [provider]  amLODipine (NORVASC) 10 MG tablet Take 10 mg by mouth daily.   Yes [provider]  Fluticasone-Salmeterol (ADVAIR) 250-50 MCG/DOSE AEPB Inhale 1 puff into the lungs 2 (two) times daily.   Yes [provider]  insulin glargine (LANTUS) 100 unit/mL SOPN Inject 6 Units into the  skin at bedtime.   Yes [provider]  insulin lispro (HUMALOG KWIKPEN) 100 UNIT/ML KwikPen Inject 6 Units into the skin 2 (two) times daily with a meal.    Yes [provider]  potassium chloride (KLOR-CON) 10 MEQ tablet Take 10 mEq by mouth 2 (two) times daily.   Yes [provider]  simvastatin (ZOCOR) 20 MG tablet Take 20 mg by mouth at bedtime.    Yes [provider]  torsemide (DEMADEX) 100 MG tablet Take 100 mg by mouth daily.   Yes [provider]  warfarin (COUMADIN) 3 MG tablet Take 1 tablet (3 mg total) by mouth one time only at 6 PM. 07/20/19  Yes Epifanio Lesches, MD  furosemide (LASIX) 40 MG tablet Take 1 tablet (40 mg total) by mouth daily. Patient not taking: Reported on 2019/11/10 07/21/19   Epifanio Lesches, MD  pantoprazole (PROTONIX) 40 MG tablet Take 1 tablet (40 mg total) by mouth 2 (two) times daily. Patient not taking: Reported on 2019/11/10 07/20/19   Epifanio Lesches, MD   Allergies  Allergen Reactions  . Penicillins Itching, Rash and Other (See Comments)    Did it involve swelling of the face/tongue/throat, SOB, or low BP? NO Did it involve sudden or severe rash/hives, skin peeling, or any reaction on  the inside of your mouth or nose? Yes Did you need to seek medical attention at a hospital or doctor's office? Unknown When did it last happen?>35 years If all above answers are "NO", may proceed with cephalosporin use.   Review of Systems  Unable to perform ROS: Acuity of condition    Physical Exam Constitutional:      Appearance: She is overweight. She is ill-appearing.     Interventions: Face mask in place.  Cardiovascular:     Rate and Rhythm: Tachycardia present.  Pulmonary:     Effort: Tachypnea present.     Breath sounds: Decreased air movement present.  Skin:    General: Skin is warm and dry.  Neurological:     Mental Status: She is lethargic.     Vital Signs: BP 137/75   Pulse 66   Temp (!) 96.3 F (35.7 C)   Resp (!) 26   Ht 5' (1.524 m)   Wt 72.6 kg   SpO2 99%   BMI 31.25 kg/m  Pain Scale: 0-10   Pain Score: 0-No pain   SpO2: SpO2: 99 % O2 Device:SpO2: 99 % O2 Flow Rate: .O2 Flow Rate (L/min): 15 L/min  IO: Intake/output summary:   Intake/Output Summary (Last 24 hours) at 2019-11-10 1503 Last data filed at 11-10-19 1223 Gross per 24 hour  Intake 257.47 ml  Output 30 ml  Net 227.47 ml    LBM:   Baseline Weight: Weight: 72.6 kg Most recent weight: Weight: 72.6 kg     Palliative Assessment/Data: 20 % at best    Discussed with Dr Priscella Mann and bedside RNs  Time In: 1415 Time Out: 1530 Time Total: 70 minutes Greater than 50%  of this time was spent counseling and coordinating care related to the above assessment and plan.  Signed by: Wadie Lessen, NP   Please contact Palliative Medicine Team phone at [phone] for questions and concerns.  For individual provider: See Shea Evans

## 2019-11-04 NOTE — ED Notes (Signed)
Family at bedside with Palliative NP for withdrawal of care and comfort measures. PRN ativan given per order.

## 2019-11-04 NOTE — ED Notes (Signed)
bair hugger placed on patient

## 2019-11-04 NOTE — ED Notes (Signed)
Pt pulled off BIPAP and removed PIV to R AC. EDP aware. bipap reapplied by RN 91% at this time. Safety sitter now at bedside.

## 2019-11-04 NOTE — ED Triage Notes (Addendum)
From home c/o weakness, not acting right for last few days. SB, hypotensive, hypoxic on EMS arrival. EDP at bedside.  Received Narcan, solumedrol, albuterol and duoneb en route with EMS

## 2019-11-04 NOTE — ED Notes (Signed)
BiPap removed per order. Family at bedside.

## 2019-11-04 NOTE — ED Provider Notes (Signed)
[facility] Emergency Department Provider Note       Time seen: ----------------------------------------- 8:31 AM on 11/30/19 ----------------------------------------- Level V caveat: History/ROS limited by altered mental status  I have reviewed the triage vital signs and the nursing notes.  HISTORY   Chief Complaint Weakness    HPI Laura Kim is a 86 y.o. female with a history of coronary artery disease, diabetes, seizures who presents to the ED for weakness for the past several days.  Patient's not been acting right, has been short of breath, hypotensive, hypoxic on EMS arrival.  Patient responds to verbal stimuli.  She is a DO NOT RESUSCITATE.  Past Medical History:  Diagnosis Date  . Coronary artery disease   . Diabetes mellitus without complication ([place])   . Seizures [facility])     Patient Active Problem List   Diagnosis Date Noted  . Melena   . Duodenal arteriovenous malformation   . Upper GI bleed   . Anemia 07/13/2019    Past Surgical History:  Procedure Laterality Date  . ABDOMINAL HYSTERECTOMY    . CARDIAC SURGERY    . CHOLECYSTECTOMY    . COLONOSCOPY WITH ESOPHAGOGASTRODUODENOSCOPY (EGD)    . ESOPHAGOGASTRODUODENOSCOPY (EGD) WITH PROPOFOL N/A 07/15/2019   Procedure: ESOPHAGOGASTRODUODENOSCOPY (EGD) WITH PROPOFOL;  Surgeon: [name], MD;  Location: [facility] ENDOSCOPY;  Service: Endoscopy;  Laterality: N/A;    Allergies Penicillins  Social History Social History   Tobacco Use  . Smoking status: Never Smoker  . Smokeless tobacco: Never Used  Substance Use Topics  . Alcohol use: Never  . Drug use: Never    Review of Systems Constitutional: Negative for fever. Cardiovascular: Negative for chest pain. Respiratory: Positive for shortness of breath Gastrointestinal: Negative for abdominal pain, vomiting and diarrhea. Musculoskeletal: Negative for back pain. Skin: Negative for rash. Neurological: Positive for  weakness  All systems negative/normal/unremarkable except as stated in the HPI  ____________________________________________   PHYSICAL EXAM:  VITAL SIGNS: ED Triage Vitals  Enc Vitals Group     BP 2019-11-30 0821 (!) 127/47     Pulse Rate November 30, 2019 0821 (!) 51     Resp Nov 30, 2019 0821 18     Temp 2019-11-30 0821 (!) 94 F (34.4 C)     Temp Source 11-30-19 0821 Rectal     SpO2 11/30/2019 0818 (!) 66 %     Weight 11/30/2019 0821 160 lb (72.6 kg)     Height November 30, 2019 0821 5' (1.524 m)     Head Circumference --      Peak Flow --      Pain Score Nov 30, 2019 0821 0     Pain Loc --      Pain Edu? --      Excl. in [place]? --    Constitutional: Lethargic but responds to verbal stimuli, chronically ill-appearing Eyes: Conjunctivae are normal. Normal extraocular movements. ENT      Head: Normocephalic and atraumatic.      Nose: No congestion/rhinnorhea.       Mouth/Throat: Mucous membranes are moist.      Neck: No stridor. Cardiovascular: Normal rate, regular rhythm. No murmurs, rubs, or gallops. Respiratory: Tachypnea with rales, left greater than right Gastrointestinal: Soft and nontender. Normal bowel sounds Musculoskeletal: Nontender with normal range of motion in extremities.  Marked pitting edema is noted Neurologic:  NNo gross focal neurologic deficits are appreciated.  Generalized weakness, nothing focal Skin:  Skin is warm, dry and intact.  There is some drainage from the lower extremities from  severe pitting edema Psychiatric: Mood and affect are normal.  ____________________________________________  EKG: Interpreted by me.  Atrial fibrillation with a rate of 49 bpm, abnormal Q waves, possible anterior infarct old, nonspecific ST segment changes, long QT  ____________________________________________  ED COURSE:  As part of my medical decision making, I reviewed the following data within the [place] History obtained from family if available, nursing notes, old chart  and ekg, as well as notes from prior ED visits. Patient presented for weakness, altered mental status, dyspnea, we will assess with labs and imaging as indicated at this time.   Procedures  Laura Kim was evaluated in Emergency Department on 2019-11-07 for the symptoms described in the history of present illness. She was evaluated in the context of the global COVID-19 pandemic, which necessitated consideration that the patient might be at risk for infection with the SARS-CoV-2 virus that causes COVID-19. Institutional protocols and algorithms that pertain to the evaluation of patients at risk for COVID-19 are in a state of rapid change based on information released by regulatory bodies including the CDC and federal and state organizations. These policies and algorithms were followed during the patient's care in the ED.  ____________________________________________   LABS (pertinent positives/negatives)  Labs Reviewed  CBC WITH DIFFERENTIAL/PLATELET - Abnormal; Notable for the following components:      Result Value   Hemoglobin 9.2 (*)    HCT 33.2 (*)    MCV 69.2 (*)    MCH 19.2 (*)    MCHC 27.7 (*)    RDW 18.9 (*)    nRBC 1.3 (*)    Neutro Abs 8.0 (*)    Lymphs Abs 0.6 (*)    All other components within normal limits  BASIC METABOLIC PANEL - Abnormal; Notable for the following components:   Sodium 125 (*)    Potassium 6.6 (*)    Chloride 91 (*)    CO2 19 (*)    Glucose, Bld 124 (*)    BUN 101 (*)    Creatinine, Ser 4.46 (*)    Calcium 8.1 (*)    GFR calc non Af Amer 8 (*)    GFR calc Af Amer 10 (*)    All other components within normal limits  BLOOD GAS, VENOUS - Abnormal; Notable for the following components:   pH, Ven 7.10 (*)    pCO2, Ven 61 (*)    pO2, Ven 53.0 (*)    Bicarbonate 18.9 (*)    Acid-base deficit 11.4 (*)    All other components within normal limits  LACTIC ACID, PLASMA - Abnormal; Notable for the following components:   Lactic Acid, Venous 3.4 (*)     All other components within normal limits  PROTIME-INR - Abnormal; Notable for the following components:   Prothrombin Time 45.7 (*)    INR 4.9 (*)    All other components within normal limits  GLUCOSE, CAPILLARY - Abnormal; Notable for the following components:   Glucose-Capillary 119 (*)    All other components within normal limits  URINALYSIS, COMPLETE (UACMP) WITH MICROSCOPIC - Abnormal; Notable for the following components:   Color, Urine YELLOW (*)    APPearance TURBID (*)    Hgb urine dipstick SMALL (*)    Protein, ur 100 (*)    Leukocytes,Ua SMALL (*)    RBC / HPF >50 (*)    WBC, UA >50 (*)    All other components within normal limits  TROPONIN I (HIGH SENSITIVITY) - Abnormal; Notable  for the following components:   Troponin I (High Sensitivity) 138 (*)    All other components within normal limits  CULTURE, BLOOD (ROUTINE X 2)  CULTURE, BLOOD (ROUTINE X 2)  URINE CULTURE  RESPIRATORY PANEL BY RT PCR (FLU A&B, COVID)  BRAIN NATRIURETIC PEPTIDE  POC SARS CORONAVIRUS 2 AG -  ED  POC SARS CORONAVIRUS 2 AG   CRITICAL CARE Performed by: Laurence Aly   Total critical care time: 30 minutes  Critical care time was exclusive of separately billable procedures and treating other patients.  Critical care was necessary to treat or prevent imminent or life-threatening deterioration.  Critical care was time spent personally by me on the following activities: development of treatment plan with patient and/or surrogate as well as nursing, discussions with consultants, evaluation of patient's response to treatment, examination of patient, obtaining history from patient or surrogate, ordering and performing treatments and interventions, ordering and review of laboratory studies, ordering and review of radiographic studies, pulse oximetry and re-evaluation of patient's condition.  RADIOLOGY Images were viewed by me  Chest x-ray IMPRESSION:  Near complete opacification of  the right hemithorax probably  secondary to pleural fluid and pulmonary collapse/infiltrate. Small  pleural fluid on the left.  ____________________________________________   DIFFERENTIAL DIAGNOSIS   CHF, renal failure, COPD, pneumonia, COVID-19, end-of-life care  FINAL ASSESSMENT AND PLAN  Weakness, hypothermia, bradycardia, likely urosepsis, right hemithorax opacification, hyperkalemia, chronic renal failure   Plan: The patient had presented for weakness with multiple issues discovered by EMS.. Patient's labs indicate likely multiorgan system failure which is multifactorial.  She has apparent sepsis from a urinary tract infection.  Potassium is markedly elevated in the setting of chronic renal failure.  Should be given insulin, D50, calcium and sodium bicarb.  Surprisingly she is improving on BiPAP, patient's imaging revealed right hemithorax opacification.  I have also ordered IV Lasix for her.  Lasix should also help her high potassium.  I discussed her prognosis with the family.  We will consult palliative care.   Laurence Aly, MD    Note: This note was generated in part or whole with voice recognition software. Voice recognition is usually quite accurate but there are transcription errors that can and very often do occur. I apologize for any typographical errors that were not detected and corrected.     Earleen Newport, MD 11/16/2019 [phone]

## 2019-11-04 NOTE — Progress Notes (Signed)
   2019/11/25 1619  Clinical Encounter Type  Visited With Patient not available  Visit Type Initial  Referral From Nurse  Consult/Referral To Chaplain  Spiritual Encounters  Spiritual Needs Other (Comment)  [facility] received Order Requisition at 1619. Arrived at room at 1715 and patient was already deceased and family had left. No further actions needed at this time.

## 2019-11-04 NOTE — ED Notes (Signed)
Date and time results received: 2019-11-25 9:10 AM  (use smartphrase ".now" to insert current time)  Test: lactic Critical Value: 3.4 Name of Provider Notified: Dr. Scarlette Slice

## 2019-11-04 NOTE — ED Notes (Signed)
Referral made to [facility] 10/06/2019-074, representative Tonye Becket. Will call back to report official time of death, although patient will not be eligible for donation secondary to age.

## 2019-11-04 NOTE — ED Notes (Signed)
Pt pulled off BIPAP. This RN reapplied NRB at 15L to get oxygen back up to 86%. Then reapplied BIPAP as pt would tolerate. Pt now wearing, saturation at 91%.

## 2019-11-04 NOTE — ED Notes (Signed)
Pt placed in body bag, gown and two socks remained on patient.

## 2019-11-04 NOTE — Death Summary Note (Signed)
Death Summary  Laura Kim [id number] DOB: Dec 15, 1929 DOA: 24-Nov-2019  PCP: Kirk Ruths, MD PCP/Office notified: No  Admit date: 11-24-19 Date of Death: November 24, 2019  Final Diagnoses:  Active Problems:   Sepsis ([place])  Acute hypercarbic respiratory failure Likely secondary to sepsis and uremic encephalopathy Severe sepsis  Urinary tract infection Hypotension Acute toxic metabolic encephalopathy Acute multiorgan dysfunction Acute hypothermia Acute uremia Suspected uremic encephalopathy Acute renal failure on chronic kidney disease stage IV Hyponatremia Hyperkalemia Lactic acidosis Chronic atrial fibrillation Chronic anticoagulation Supratherapeutic INR Volume overloaded state Right hemithorax opacification Coronary artery disease Hypertension Hyperlipidemia Diabetes mellitus    History of present illness:   Laura Kim is a 86 y.o. female with medical history significant of coronary artery disease, diabetes,, hypertension, permanent atrial fibrillation, chronic anticoagulation, chronic kidney disease stage IV not on hemodialysis presents emergency department for worsening weakness times several days.  On my arrival the patient is on BiPAP therapy and is unable to provide any history.  History is obtained by reviewing the medical record and speaking with the ED physician.  Per ED physician the patient noted to be weak and "not acting right" for the last several days.  On arrival the patient was noted to be hypotensive, hypoxic, hypothermic.  Laboratory investigations significant for multiple derangements including hyponatremia to 125, hyperkalemia 6.6, uremic with BUN of 101, elevated B natruretic peptide to 3000, elevated high-sensitivity troponin to 138, elevated lactic acid 3.4, elevated INR to 4.9.  Patient received 1 dose of IV Lasix 80 mg and started on BiPAP therapy.  Per ED physician to good result.  He was also started on Bair hugger for  hypothermia.  Urinalysis significant for evidence of infection.  The ED physician did speak with the patient's family who agreed to a DNR status.  ED physician also consulted palliative care who agreed to see the patient during hospitalization.  Patient was started on broad-spectrum antibiotics after culture and hospitalist was called for admission.   Hospital Course:  Patient was admitted for severe sepsis secondary to UTI with associated multiorgan dysfunction.  She was started on BiPAP therapy considering hypoxia on admission.  She did seem to respond appropriately to this however remained fairly altered and was significantly agitated while on the noninvasive positive pressure ventilation.  Called and had a lengthy conversation with the patient's husband and daughter.  I explained that the patient was likely suffering from multiorgan dysfunction we would treat her aggressively for severe sepsis but that her clinical course would likely be difficult.  The patient's daughter explained to me that it was against the patient's wishes to have any aggressive life-saving measures.  Palliative care service was involved at time of emergency department presentation.  I spoke to the palliative care nurse practitioner who did evaluate the patient at bedside and spoke with the family.  Decision was made to pursue comfort measures.  Patient was kept on BiPAP therapy until arrival of the family.  Once the family arrived they were able to visit the BiPAP mask was removed.  On speaking with the bedside nurse the patient passed approximately 1 hour after removal of the BiPAP mask.  Family was present at bedside.   Time of death: 18-Feb-1700  Signed:  Sidney Ace  [facility] 11-24-2019, 5:59 PM

## 2019-11-04 NOTE — ED Notes (Signed)
Tressia Danas, RN and Erby Pian, RN listened independently for a full minute. Confirmed no heart sounds or breath sounds. Asystole on the monitor.

## 2019-11-04 NOTE — Consult Note (Signed)
Pharmacy Antibiotic Note  Laura Kim is a 86 y.o. female admitted on Nov 09, 2019 with sepsis.  Pharmacy has been consulted for Vancomycin dosing. Per ED nurse, she is unaware of pt being a dialysis patient as it is not noted in the patient's report. Additionally, patient unable to provide hx.   Bl Scr (07/20/2019): 4.46  Vancomycin 1g x1 in the ED @ 0917.  Plan: 1. Vancomycin: Will order an additional 750 mg x1 dose (total 1750 mg dose). Will check a 36- hour VR. Will f/u on renal function with AM labs tomorrow.     Height: 5' (152.4 cm) Weight: 160 lb (72.6 kg) IBW/kg (Calculated) : 45.5  Temp (24hrs), Avg:94 F (34.4 C), Min:94 F (34.4 C), Max:94 F (34.4 C)  Recent Labs  Lab 11/09/19 0830  WBC 9.5  CREATININE 4.46*  LATICACIDVEN 3.4*    Estimated Creatinine Clearance: 7.9 mL/min (A) (by C-G formula based on SCr of 4.46 mg/dL (H)).    Allergies  Allergen Reactions  . Penicillins Itching, Rash and Other (See Comments)    Did it involve swelling of the face/tongue/throat, SOB, or low BP? NO Did it involve sudden or severe rash/hives, skin peeling, or any reaction on the inside of your mouth or nose? Yes Did you need to seek medical attention at a hospital or doctor's office? Unknown When did it last happen?>35 years If all above answers are "NO", may proceed with cephalosporin use.    Antimicrobials this admission: 12/28 Cefepime >> 12/28 Vancomycin >>  Dose adjustments this admission:   Microbiology results:  BCx:  UCx:   Sputum:  MRSA PCR:   Thank you for allowing pharmacy to be a part of this patient's care.  Rowland Lathe 11-09-19 10:29 AM

## 2019-11-04 NOTE — H&P (Signed)
History and Physical    Laura Kim [id number] DOB: [birth year] DOA: 11-05-2019  PCP: Kirk Ruths, MD  Patient coming from: Home  I have personally briefly reviewed patient's old medical records in [place]  Chief Complaint: Severe weakness  HPI: Laura Kim is a 86 y.o. female with medical history significant of coronary artery disease, diabetes,, hypertension, permanent atrial fibrillation, chronic anticoagulation, chronic kidney disease stage IV not on hemodialysis presents emergency department for worsening weakness times several days.  On my arrival the patient is on BiPAP therapy and is unable to provide any history.  History is obtained by reviewing the medical record and speaking with the ED physician.  Per ED physician the patient noted to be weak and "not acting right" for the last several days.  On arrival the patient was noted to be hypotensive, hypoxic, hypothermic.  Laboratory investigations significant for multiple derangements including hyponatremia to 125, hyperkalemia 6.6, uremic with BUN of 101, elevated B natruretic peptide to 3000, elevated high-sensitivity troponin to 138, elevated lactic acid 3.4, elevated INR to 4.9.  Patient received 1 dose of IV Lasix 80 mg and started on BiPAP therapy.  Per ED physician to good result.  He was also started on Bair hugger for hypothermia.  Urinalysis significant for evidence of infection.  The ED physician did speak with the patient's family who agreed to a DNR status.  ED physician also consulted palliative care who agreed to see the patient during hospitalization.  Patient was started on broad-spectrum antibiotics after culture and hospitalist was called for admission.  ED Course: On arrival patient is requiring oxygen with greater.  She was started on BiPAP therapy.  However after hypoxia and hypothermia were noted.  Patient was pancultured and broad-spectrum antibiotics were initiated.  Per ED physician  patient responded well to the initiation of BiPAP therapy.  Palliative care service contacted by ED physician who agreed to consult on patient.  Hospitalist called for inpatient admission.  Review of Systems: As per HPI otherwise 10 point review of systems negative.   Past Medical History:  Diagnosis Date  . Coronary artery disease   . Diabetes mellitus without complication ([place])   . Seizures ([place])     Past Surgical History:  Procedure Laterality Date  . ABDOMINAL HYSTERECTOMY    . CARDIAC SURGERY    . CHOLECYSTECTOMY    . COLONOSCOPY WITH ESOPHAGOGASTRODUODENOSCOPY (EGD)    . ESOPHAGOGASTRODUODENOSCOPY (EGD) WITH PROPOFOL N/A 07/15/2019   Procedure: ESOPHAGOGASTRODUODENOSCOPY (EGD) WITH PROPOFOL;  Surgeon: [name], MD;  Location: [facility] ENDOSCOPY;  Service: Endoscopy;  Laterality: N/A;     reports that she has never smoked. She has never used smokeless tobacco. She reports that she does not drink alcohol or use drugs.  Allergies  Allergen Reactions  . Penicillins Itching, Rash and Other (See Comments)    Did it involve swelling of the face/tongue/throat, SOB, or low BP? NO Did it involve sudden or severe rash/hives, skin peeling, or any reaction on the inside of your mouth or nose? Yes Did you need to seek medical attention at a hospital or doctor's office? Unknown When did it last happen?>35 years If all above answers are "NO", may proceed with cephalosporin use.    No family history on file. Unable to obtain due to AMS  Prior to Admission medications   Medication Sig Start Date End Date Taking? Authorizing Provider  amLODipine (NORVASC) 10 MG tablet Take 10 mg by mouth daily.    [provider]  Fluticasone-Salmeterol (ADVAIR) 250-50 MCG/DOSE AEPB Inhale 1 puff into the lungs 2 (two) times daily.    [provider]  furosemide (LASIX) 40 MG tablet Take 1 tablet (40 mg total) by mouth daily. 07/21/19   Epifanio Lesches, MD  insulin glargine (LANTUS)  100 unit/mL SOPN Inject 6 Units into the skin at bedtime.    [provider]  insulin lispro (HUMALOG KWIKPEN) 100 UNIT/ML KwikPen Inject 6 Units into the skin 2 (two) times daily with a meal.     [provider]  pantoprazole (PROTONIX) 40 MG tablet Take 1 tablet (40 mg total) by mouth 2 (two) times daily. 07/20/19   Epifanio Lesches, MD  simvastatin (ZOCOR) 20 MG tablet Take 20 mg by mouth daily.    [provider]  warfarin (COUMADIN) 3 MG tablet Take 1 tablet (3 mg total) by mouth one time only at 6 PM. 07/20/19   Epifanio Lesches, MD    Physical Exam: Vitals:   Nov 23, 2019 0818 11/23/2019 0821 11/23/2019 0900  BP:  (!) 127/47   Pulse:  (!) 51 (!) 54  Resp:  18 (!) 28  Temp:  (!) 94 F (34.4 C)   TempSrc:  Rectal   SpO2: (!) 66% 92% 94%  Weight:  72.6 kg   Height:  5' (1.524 m)     Constitutional: NAD, calm, comfortable Vitals:   11/23/2019 0818 11/23/19 0821 2019-11-23 0900  BP:  (!) 127/47   Pulse:  (!) 51 (!) 54  Resp:  18 (!) 28  Temp:  (!) 94 F (34.4 C)   TempSrc:  Rectal   SpO2: (!) 66% 92% 94%  Weight:  72.6 kg   Height:  5' (1.524 m)    Eyes: Unable to assess ENMT: Mucous membranes are dry. Posterior pharynx clear of any exudate or lesions.Normal dentition.  Neck: normal, supple, no masses, no thyromegaly Respiratory: Diffuse scattered crackles  cardiovascular: Bradycardic, regular rhythm, no appreciable murmur Abdomen: Protuberant, positive bowel sounds, soft  musculoskeletal: no clubbing / cyanosis. No joint deformity upper and lower extremities.  Skin: Dry, pale.  Scattered ecchymoses Neurologic: Unable to assess due to mental status.  Psychiatric: Unable to assess due to mental status  Labs on Admission: I have personally reviewed following labs and imaging studies  CBC: Recent Labs  Lab 2019-11-23 0830  WBC 9.5  NEUTROABS 8.0*  HGB 9.2*  HCT 33.2*  MCV 69.2*  PLT 99991111   Basic Metabolic Panel: Recent Labs  Lab  2019/11/23 0830  NA 125*  K 6.6*  CL 91*  CO2 19*  GLUCOSE 124*  BUN 101*  CREATININE 4.46*  CALCIUM 8.1*   GFR: Estimated Creatinine Clearance: 7.9 mL/min (A) (by C-G formula based on SCr of 4.46 mg/dL (H)). Liver Function Tests: No results for input(s): AST, ALT, ALKPHOS, BILITOT, PROT, ALBUMIN in the last 168 hours. No results for input(s): LIPASE, AMYLASE in the last 168 hours. No results for input(s): AMMONIA in the last 168 hours. Coagulation Profile: Recent Labs  Lab 11/23/2019 0830  INR 4.9*   Cardiac Enzymes: No results for input(s): CKTOTAL, CKMB, CKMBINDEX, TROPONINI in the last 168 hours. BNP (last 3 results) No results for input(s): PROBNP in the last 8760 hours. HbA1C: No results for input(s): HGBA1C in the last 72 hours. CBG: Recent Labs  Lab 11/23/19 0828  GLUCAP 119*   Lipid Profile: No results for input(s): CHOL, HDL, LDLCALC, TRIG, CHOLHDL, LDLDIRECT in the last 72 hours. Thyroid Function Tests: No results  for input(s): TSH, T4TOTAL, FREET4, T3FREE, THYROIDAB in the last 72 hours. Anemia Panel: No results for input(s): VITAMINB12, FOLATE, FERRITIN, TIBC, IRON, RETICCTPCT in the last 72 hours. Urine analysis:    Component Value Date/Time   COLORURINE YELLOW (A) 2019/11/07 0859   APPEARANCEUR TURBID (A) November 07, 2019 0859   APPEARANCEUR Cloudy 12/22/2012 1927   LABSPEC 1.011 November 07, 2019 0859   LABSPEC 1.010 12/22/2012 1927   PHURINE 5.0 November 07, 2019 0859   GLUCOSEU NEGATIVE November 07, 2019 0859   GLUCOSEU Negative 12/22/2012 1927   HGBUR SMALL (A) Nov 07, 2019 0859   BILIRUBINUR NEGATIVE 2019-11-07 0859   BILIRUBINUR Negative 12/22/2012 [place] NEGATIVE Nov 07, 2019 0859   PROTEINUR 100 (A) Nov 07, 2019 0859   NITRITE NEGATIVE 11/07/19 0859   LEUKOCYTESUR SMALL (A) 2019/11/07 0859   LEUKOCYTESUR 3+ 12/22/2012 1927    Radiological Exams on Admission: DG Chest Port 1 View  Result Date: 11/07/19 CLINICAL DATA:  Weakness and shortness of breath.   Hypoxic. EXAM: PORTABLE CHEST 1 VIEW COMPARISON:  07/17/2019 FINDINGS: Previous median sternotomy and CABG. Near complete opacification of the right chest probably due to a combination of pleural fluid and pulmonary collapse/infiltrate. Mild patchy density at the left lung base with a small amount of pleural fluid in the left costophrenic angle. IMPRESSION: Near complete opacification of the right hemithorax probably secondary to pleural fluid and pulmonary collapse/infiltrate. Small pleural fluid on the left. Electronically Signed   By: Nelson Chimes M.D.   On: 11-07-19 08:47    EKG: Independently reviewed.  Atrial fibrillation bradycardia.  Assessment/Plan Active Problems:   Sepsis ([place])  Acute hypercarbic respiratory failure Likely secondary to sepsis and uremic encephalopathy Currently on BiPAP therapy We will continue BiPAP on stepdown unit Wean as tolerated Palliative care consultation pending  Severe sepsis  Urinary tract infection Hypotension Acute toxic metabolic encephalopathy Acute multiorgan dysfunction Acute hypothermia Patient baseline is unclear, apparently she came home Primary reason for ER visit was altered mentation Criteria for severe sepsis with endorgan dysfunction Per ED physician family agreed to consultation with palliative service Plan: Admit to stepdown unit Continue BiPAP for now, wean as tolerated Continue Bair hugger Pancultured in the ED, follow-up results Continue vancomycin with pharmacy dosing Continue cefepime with renally adjusted dose No fluids considering clinical volume overloaded state Follow-up palliative care recommendation DNR status Spoke at length the patient's daughter and husband via phone.  Explained that patient is likely suffering from multiorgan dysfunction in the setting of sepsis.  Patient's family agreed to palliative care consultations.  Acute uremia Suspected uremic encephalopathy Acute renal failure on chronic kidney  disease stage IV Hyponatremia Hyperkalemia Lactic acidosis Patient's metabolic derangements appear all secondary to sepsis Patient received treatment for acute hyperkalemia in the emergency department Suspect uremia may be contributing to encephalopathy Plan: Pericardiocentesis above Trend lactic acid Recheck BMP at 1400 today Treat acute hyperkalemia as needed Monitor mental status or changes Telemetry monitoring Consider nephrology input should aggressive care continue to be wanted  Chronic atrial fibrillation Chronic anticoagulation Supratherapeutic INR Plan telemetry and EKG patient appears to be in atrial fibrillation with slow rate No indication for rate control at this time Supratherapeutic INR noted Likely in setting of sepsis and multiorgan dysfunction Hold home warfarin Check daily INR No indication for transfusion at this time  Volume overloaded state Right hemithorax opacification Chest x-ray significant for a right-sided opacification Suspect fluid and volume overload Etiology likely multifactorial Last echocardiogram in September 2020 not demonstrating reduced ejection fraction or impaired relaxation Patient received 80 mg  of IV Lasix in the emergency department Plan: Antibiotics and treatment for sepsis as above As needed Lasix pushes for respiratory distress  Coronary artery disease Patient does have an elevation in high-sensitivity troponin Suspect this is secondary to sepsis and demand ischemia Monitor for now  Hypertension Hold home amlodipine  Hyperlipidemia Hold home Zocor for now   Diabetes mellitus Patient on 6 units of Lantus daily at home Hold Lantus Sliding scale insulin N.p.o. status for now given BiPAP and altered mentation  DVT prophylaxis: None Code Status: DO NOT RESUSCITATE Family Communication: Daughter Haig Prophet; Husband Yulisa Bobby [phone].  Called 12/28 at 1130.  Updated on clinical condition Disposition Plan:  Pending clinical course Consults called: Palliative care Admission status: SDU   Sidney Ace MD [facility] Pager [phone]  If 7PM-7AM, please contact night-coverage [email or web address] Password [facility]  05-Nov-2019, 10:28 AM

## 2019-11-04 NOTE — ED Notes (Signed)
Update of TOD (1701) provided to [employer] at [place].

## 2019-11-04 NOTE — Progress Notes (Signed)
PHARMACY NOTE:  ANTIMICROBIAL RENAL DOSAGE ADJUSTMENT  Current antimicrobial regimen includes a mismatch between antimicrobial dosage and estimated renal function.  As per policy approved by the Pharmacy & Therapeutics and Medical Executive Committees, the antimicrobial dosage will be adjusted accordingly.  Current antimicrobial dosage:  Cefepime 500 mg IV q24h  Indication: UTI/sepsis  Renal Function:  Estimated Creatinine Clearance: 7.9 mL/min (A) (by C-G formula based on SCr of 4.46 mg/dL (H)). H&P states AKI on CKD IV    Antimicrobial dosage has been changed to:  Cefepime 1 g IV q24h     Thank you for allowing pharmacy to be a part of this patient's care.  Rocky Morel, [facility] 11-16-2019 12:42 PM

## 2019-11-04 NOTE — ED Notes (Signed)
Critical glucose of 37 received from lab. Confirmed with POC FSBG 34. Patient remained alert and responsive. D50 given per protocol. Recheck sugar 118. MD notified of all.

## 2019-11-04 NOTE — ED Notes (Signed)
RT aware of ordered BIPAP

## 2019-11-04 DEATH — deceased

## 2019-11-05 LAB — CULTURE, BLOOD (ROUTINE X 2)
Culture: NO GROWTH
Special Requests: ADEQUATE

## 2019-12-22 IMAGING — US US RENAL
1 series · 14 of 25 positions shown · non-contrast
Comparison: MRI from 08/25/2013.

CLINICAL DATA: Acute renal failure

EXAM:
RENAL / URINARY TRACT ULTRASOUND COMPLETE

[Series 1: us renal · 14 of 33 slices shown]
[im 1/33]
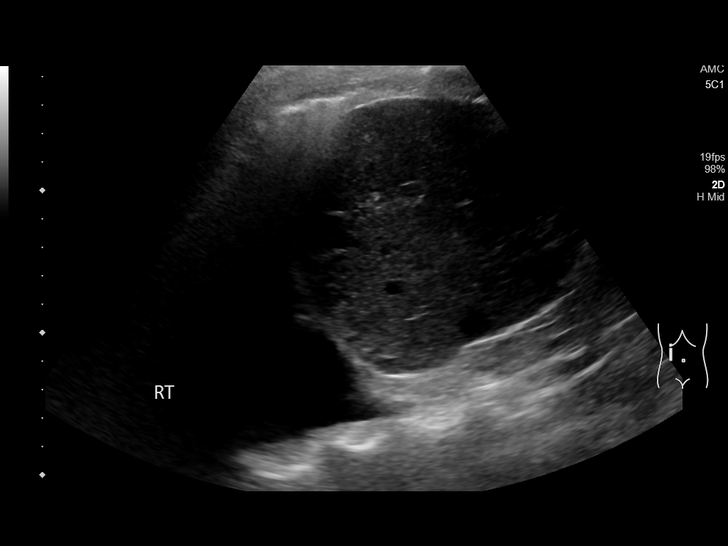
[im 3/33]
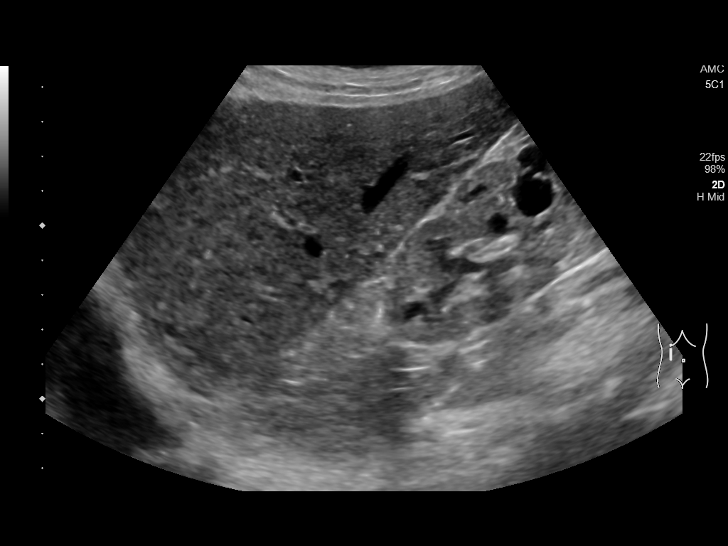
[im 6/33]
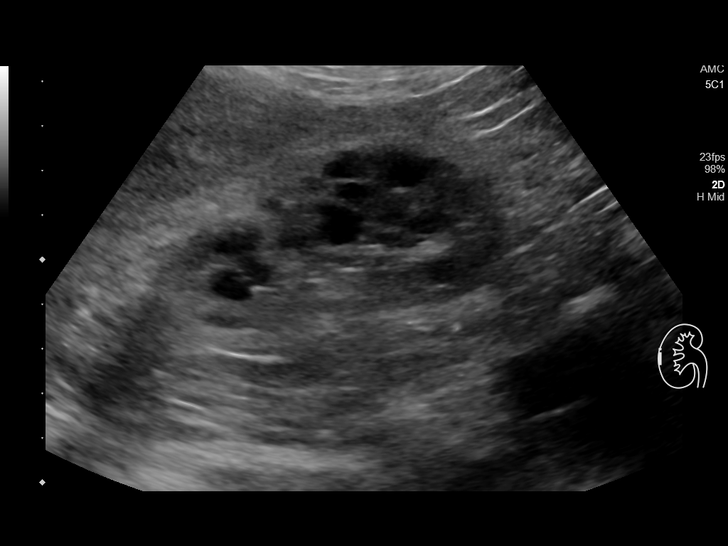
[im 9/33]
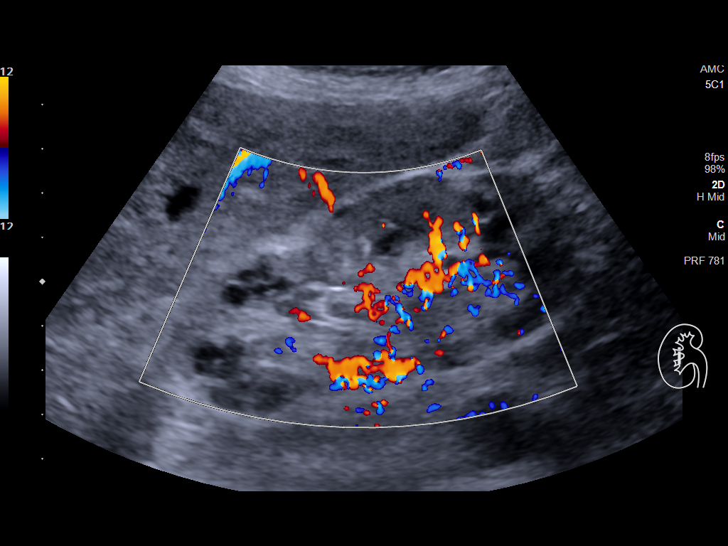
[im 11/33]
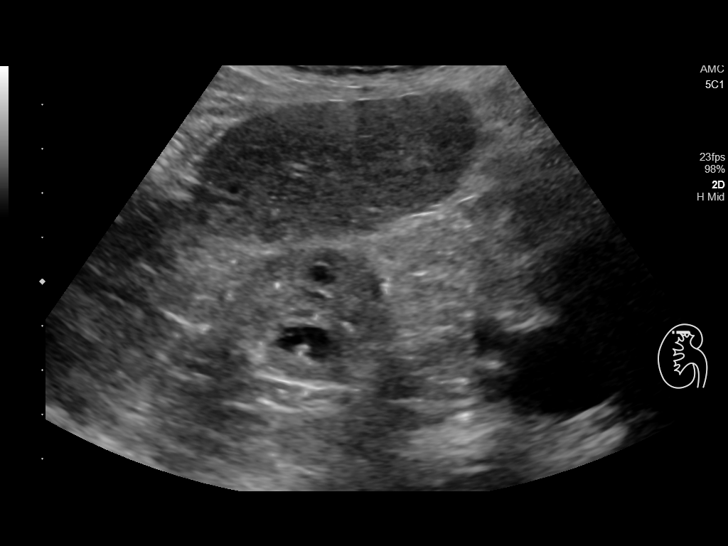
[im 13/33]
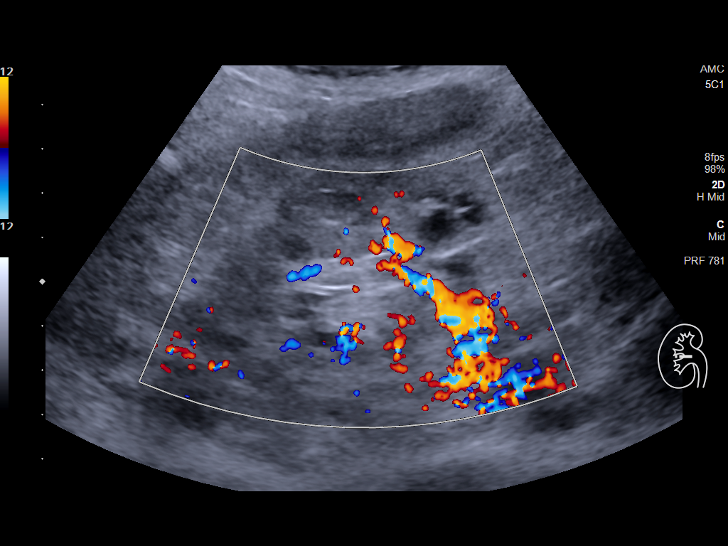
[im 15/33]
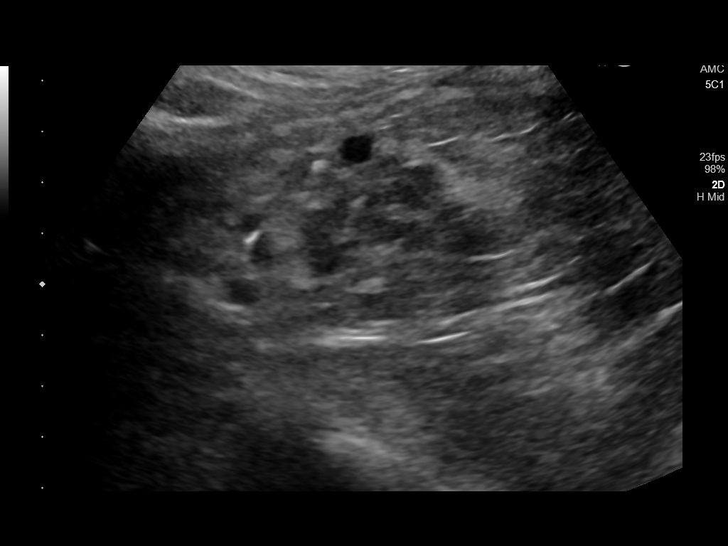
[im 18/33]
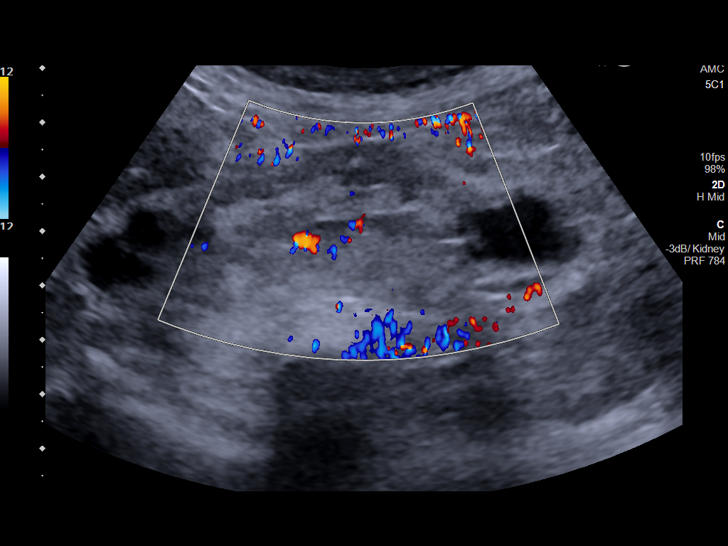
[im 21/33]
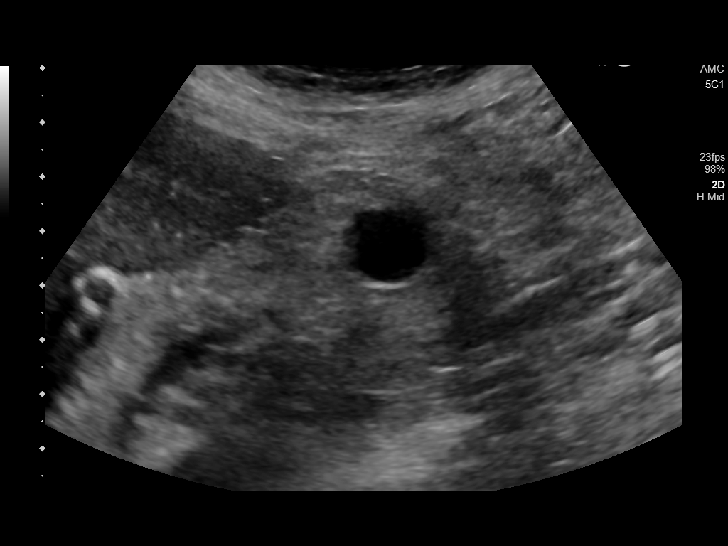
[im 22/33]
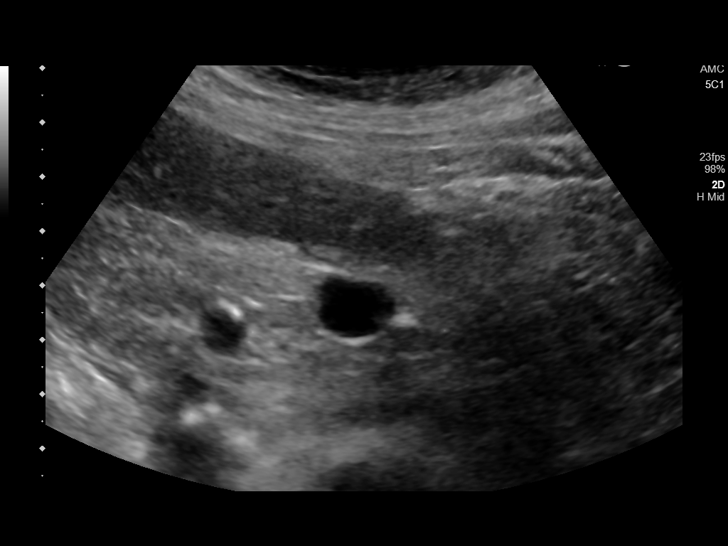
[im 25/33]
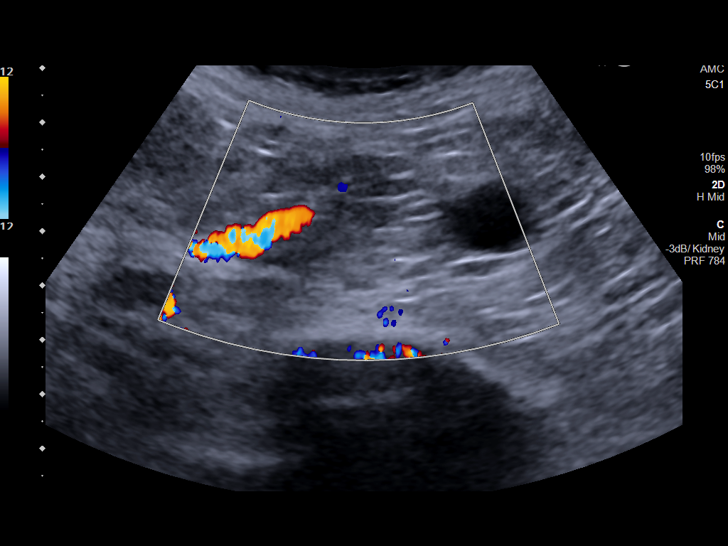
[im 27/33]
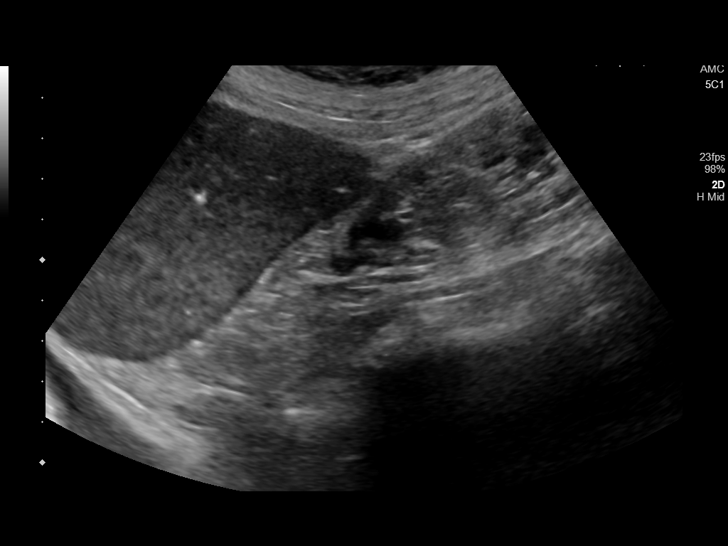
[im 30/33]
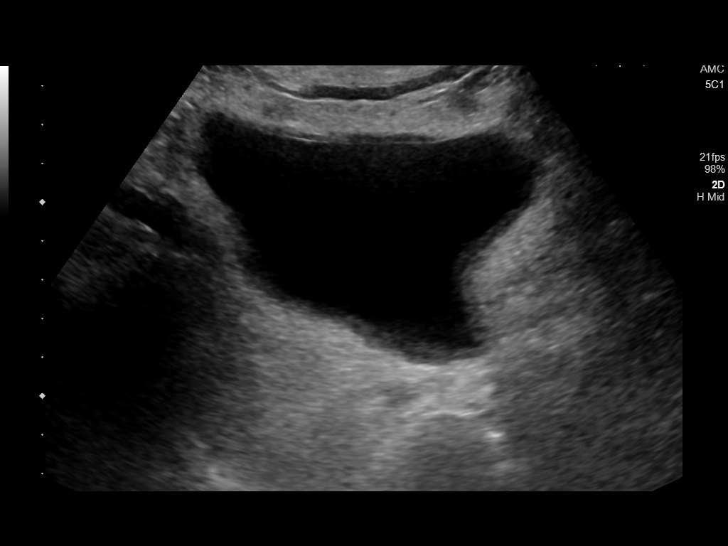
[im 33/33]
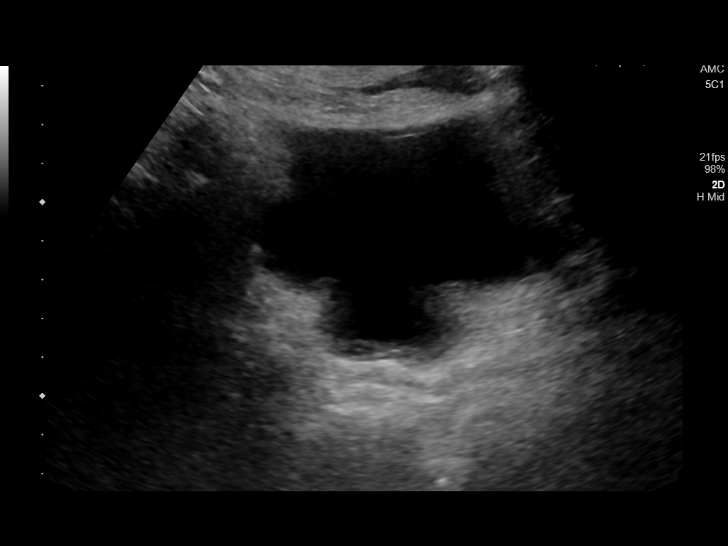

[14 of 25 positions shown; findings below may reference images not displayed]

FINDINGS: Right Kidney:

Renal measurements: 9.6 x 4.1 x 4.4 cm. = volume: 91 mL. Multiple
cysts are noted throughout the right kidney. The largest of these
measures 1.9 cm.

Left Kidney:

Renal measurements: 8.5 x 2.7 x 3.1 cm. = volume: 37 mL. Multiple
cysts are noted within the left kidney. The largest of these
measures 2.4 cm.

Bladder:

Appears normal for degree of bladder distention.

Note is made of bilateral pleural effusions. Stable right adrenal
adenoma is noted.
IMPRESSION: Bilateral renal atrophy stable from the prior exams.

Bilateral renal cysts as described.

Bilateral pleural effusions.

Stable right adrenal adenoma.

## 2019-12-24 IMAGING — DX DG CHEST 1V PORT
1 series · 1 of 1 positions shown · non-contrast
Comparison: 07/15/2019 and prior chest radiographs

CLINICAL DATA: Pneumonia.

EXAM:
PORTABLE CHEST 1 VIEW

[chest ap]
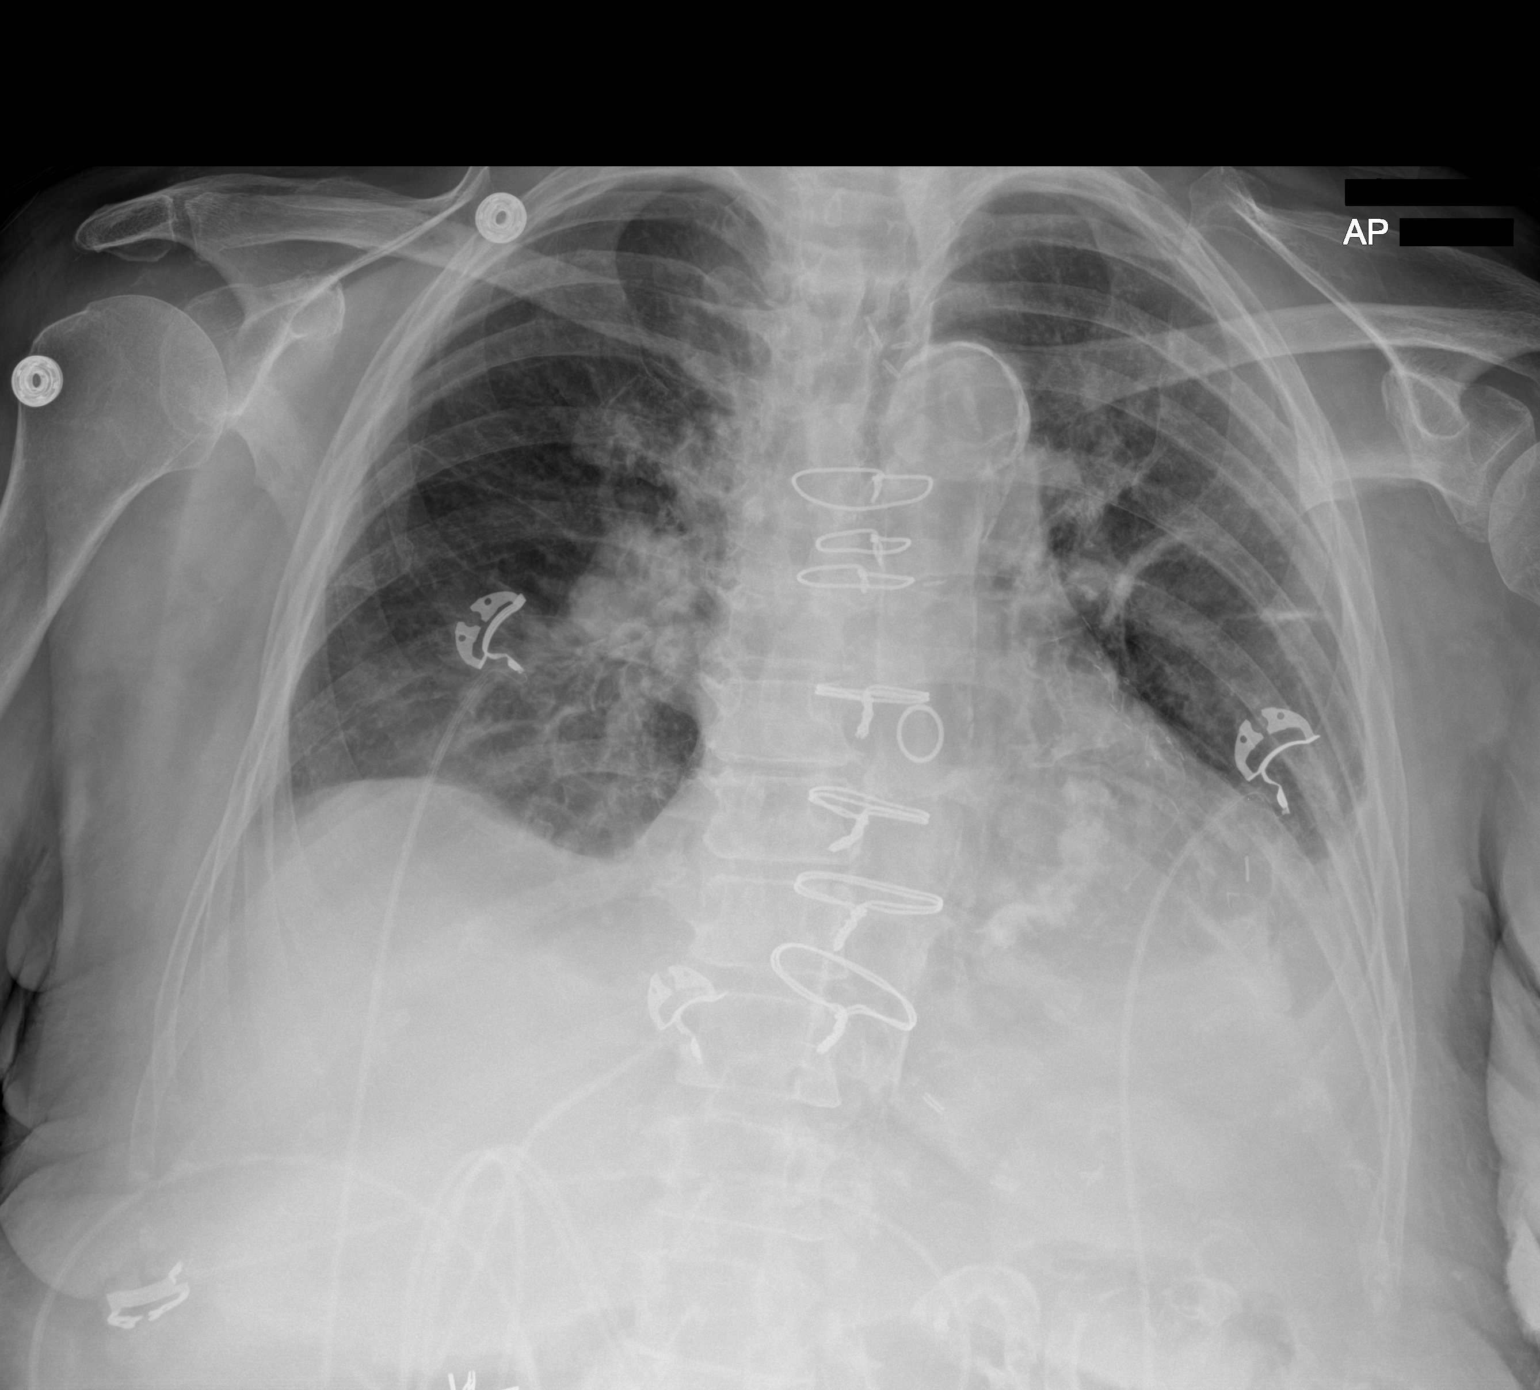

[1 of 1 positions shown; findings below may reference images not displayed]

FINDINGS: Cardiomegaly, pulmonary vascular congestion and CABG changes again
noted.

RIGHT basilar opacity has slightly decreased.

No pneumothorax noted.

LEFT basilar opacity/atelectasis with possible small LEFT pleural
effusion noted.
IMPRESSION: Slightly decreased RIGHT basilar opacity/pneumonia.

Otherwise unchanged appearance of the chest with cardiomegaly and
pulmonary vascular congestion.
